# Patient Record
Sex: Female | Born: 1970
Health system: Southern US, Community
[De-identification: ages and names within clinical notes are randomized; demographics above are authoritative.]

## PROBLEM LIST (undated history)

## (undated) DIAGNOSIS — B019 Varicella without complication: Secondary | ICD-10-CM

## (undated) DIAGNOSIS — IMO0001 Reserved for inherently not codable concepts without codable children: Secondary | ICD-10-CM

## (undated) DIAGNOSIS — R896 Abnormal cytological findings in specimens from other organs, systems and tissues: Secondary | ICD-10-CM

## (undated) DIAGNOSIS — N390 Urinary tract infection, site not specified: Secondary | ICD-10-CM

## (undated) DIAGNOSIS — T7840XA Allergy, unspecified, initial encounter: Secondary | ICD-10-CM

## (undated) DIAGNOSIS — R001 Bradycardia, unspecified: Secondary | ICD-10-CM

## (undated) DIAGNOSIS — S0300XA Dislocation of jaw, unspecified side, initial encounter: Secondary | ICD-10-CM

## (undated) DIAGNOSIS — I959 Hypotension, unspecified: Secondary | ICD-10-CM

## (undated) HISTORY — PX: OTHER SURGICAL HISTORY: SHX169

## (undated) HISTORY — DX: Dislocation of jaw, unspecified side, initial encounter: S03.00XA

## (undated) HISTORY — DX: Urinary tract infection, site not specified: N39.0

## (undated) HISTORY — DX: Varicella without complication: B01.9

## (undated) HISTORY — DX: Hypotension, unspecified: I95.9

## (undated) HISTORY — DX: Allergy, unspecified, initial encounter: T78.40XA

## (undated) HISTORY — DX: Reserved for inherently not codable concepts without codable children: IMO0001

## (undated) HISTORY — DX: Abnormal cytological findings in specimens from other organs, systems and tissues: R89.6

## (undated) HISTORY — PX: APPENDECTOMY: SHX54

## (undated) HISTORY — DX: Bradycardia, unspecified: R00.1

---

## 2000-09-27 ENCOUNTER — Other Ambulatory Visit: Admission: RE | Admit: 2000-09-27 | Discharge: 2000-09-27 | Payer: Self-pay | Admitting: *Deleted

## 2000-09-27 ENCOUNTER — Encounter (INDEPENDENT_AMBULATORY_CARE_PROVIDER_SITE_OTHER): Payer: Self-pay | Admitting: Specialist

## 2001-05-02 ENCOUNTER — Other Ambulatory Visit: Admission: RE | Admit: 2001-05-02 | Discharge: 2001-05-02 | Payer: Self-pay | Admitting: *Deleted

## 2001-07-06 ENCOUNTER — Other Ambulatory Visit: Admission: RE | Admit: 2001-07-06 | Discharge: 2001-07-06 | Payer: Self-pay | Admitting: Obstetrics and Gynecology

## 2002-01-29 ENCOUNTER — Ambulatory Visit (HOSPITAL_COMMUNITY): Admission: AD | Admit: 2002-01-29 | Discharge: 2002-01-29 | Payer: Self-pay | Admitting: Obstetrics and Gynecology

## 2002-01-29 ENCOUNTER — Encounter: Payer: Self-pay | Admitting: Obstetrics and Gynecology

## 2002-02-04 ENCOUNTER — Inpatient Hospital Stay (HOSPITAL_COMMUNITY): Admission: AD | Admit: 2002-02-04 | Discharge: 2002-02-07 | Payer: Self-pay | Admitting: Obstetrics & Gynecology

## 2002-02-04 ENCOUNTER — Inpatient Hospital Stay (HOSPITAL_COMMUNITY): Admission: AD | Admit: 2002-02-04 | Discharge: 2002-02-04 | Payer: Self-pay | Admitting: Obstetrics & Gynecology

## 2002-03-13 ENCOUNTER — Encounter: Admission: RE | Admit: 2002-03-13 | Discharge: 2002-03-13 | Payer: Self-pay | Admitting: Obstetrics and Gynecology

## 2002-03-13 ENCOUNTER — Encounter: Payer: Self-pay | Admitting: Obstetrics and Gynecology

## 2003-04-02 ENCOUNTER — Other Ambulatory Visit: Admission: RE | Admit: 2003-04-02 | Discharge: 2003-04-02 | Payer: Self-pay | Admitting: Obstetrics and Gynecology

## 2003-12-03 ENCOUNTER — Other Ambulatory Visit: Admission: RE | Admit: 2003-12-03 | Discharge: 2003-12-03 | Payer: Self-pay | Admitting: Obstetrics and Gynecology

## 2004-01-24 ENCOUNTER — Inpatient Hospital Stay (HOSPITAL_COMMUNITY): Admission: AD | Admit: 2004-01-24 | Discharge: 2004-01-24 | Payer: Self-pay | Admitting: Obstetrics and Gynecology

## 2004-05-15 ENCOUNTER — Observation Stay (HOSPITAL_COMMUNITY): Admission: AD | Admit: 2004-05-15 | Discharge: 2004-05-16 | Payer: Self-pay | Admitting: Obstetrics and Gynecology

## 2004-06-24 ENCOUNTER — Inpatient Hospital Stay (HOSPITAL_COMMUNITY): Admission: AD | Admit: 2004-06-24 | Discharge: 2004-06-27 | Payer: Self-pay | Admitting: Obstetrics and Gynecology

## 2006-07-06 ENCOUNTER — Encounter: Admission: RE | Admit: 2006-07-06 | Discharge: 2006-07-06 | Payer: Self-pay | Admitting: Obstetrics and Gynecology

## 2009-09-24 ENCOUNTER — Encounter: Admission: RE | Admit: 2009-09-24 | Discharge: 2009-09-24 | Payer: Self-pay | Admitting: Obstetrics and Gynecology

## 2010-10-02 ENCOUNTER — Encounter: Admission: RE | Admit: 2010-10-02 | Discharge: 2010-10-02 | Payer: Self-pay | Admitting: Obstetrics and Gynecology

## 2010-10-18 LAB — HM MAMMOGRAPHY: HM Mammogram: NEGATIVE

## 2011-01-18 LAB — HM PAP SMEAR: HM Pap smear: NEGATIVE
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
## 2011-05-14 NOTE — H&P (Signed)
NAME:  Danielle Weeks, Danielle Weeks                         ACCOUNT NO.:  [id number]   MEDICAL RECORD NO.:  [id number]                   PATIENT TYPE:  INP   LOCATION:  9170                                 FACILITY:  [facility]   PHYSICIAN:  Maxie Better, M.D.            DATE OF BIRTH:  03-Nov-1971   DATE OF ADMISSION:  06/24/2004  DATE OF DISCHARGE:                                HISTORY & PHYSICAL   CHIEF COMPLAINT:  Oligohydramnios.   HISTORY OF PRESENT ILLNESS:  This is a 40 year old, gravida 2, para 1-0-0-1,  married white female, LMP of September 30, 2003, [facility] of July 07, 2004 whose  currently at 38 plus weeks gestation with oligohydramnios admitted for  induction of labor secondary to oligohydramnios. The patient underwent an  ultrasound on June 24, 2004 where her amniotic fluid index was 6.8 which was  at the third percentile. Estimated fetal weight was 7 pounds 11 ounces which  was 68 percentile. The patient's prenatal course has been complicated by  previous history of oligohydramnios for which the patient was admitted to  [facility] for aggressive IV hydration with subsequent resolution of  her oligohydramnios. The patient denies any leakage of fluid, she has had  good fetal movement and some irregular contractions.  Her prenatal care has  been at [facility], primary obstetrician Maxie Better, M.D.   PRENATAL LABS:  Blood type is O positive, antibody screen is negative, RPR  is nonreactive, rubella is immune, hepatitis B surface antigen is negative,  HIV test was negative.  GC and chlamydia cultures were negative.  First  trimester genetic screen was within normal limits.  __________ defect was  normal. She had a one hour glucose challenge test of 153, her three hour  glucose challenge test was normal.  Her group B strep culture was negative.  Anatomic fetal survey was normal at 18.2 weeks on February 9.   ALLERGIES:  No known drug allergies.   MEDICATIONS:  Prenatal  vitamins.   MEDICAL HISTORY:  Possible irritable bowel syndrome.   SURGICAL HISTORY:  Negative.   OBSTETRICAL HISTORY:  7 pounds 3 ounce vacuum extraction February of 2003  for chorioamnionitis and fetal tachycardia.   FAMILY HISTORY:  Noncontributory.   SOCIAL HISTORY:  Married, nonsmoker. 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   REVIEW OF SYMPTOMS:  Negative.   PHYSICAL EXAMINATION:  GENERAL:  Well-developed, well-nourished, gravid  female in no acute distress.  VITAL SIGNS:  Blood pressure 107/74, pulse of 98, afebrile.  SKIN:  Shows no lesions.  HEENT:  Anicteric sclera, pink conjunctiva, oropharynx negative.  HEART:  Regular rate and rhythm with a grade 2/6 systolic ejection murmur.  LUNGS:  Clear to auscultation.  BREASTS:  Soft, nontender, no palpable mass.  BACK:  No CVA tenderness.  ABDOMEN:  Gravid, fundal height of 36 cm.  PELVIC:  1 cm thick, -2 vertex.  EXTREMITIES:  No  edema.  TRACING:  Fetal heart rate baseline 140, accelerations to 160, contractions  every 1-4 minutes initially now every 2 to 2.5 minutes.   IMPRESSION:  Oligohydramnios term gestation.   PLAN:  Admission.  Amniotomy p.r.n., epidural p.r.n., low dose Pitocin per  protocol.  Routine admission orders and labs.  __________fusion p.r.n.                                               Maxie Better, M.D.    [place]/MEDQ  D:  06/24/2004  T:  06/24/2004  Job:  [id number]

## 2011-05-14 NOTE — H&P (Signed)
[facility]  Patient:    Danielle Weeks, Danielle Weeks Visit Number: [id number] MRN: [id number]          Service Type: OBS Location: 910B 9160 01 Attending Physician:  Genia Del Dictated by:   Genia Del, M.D. Admit Date:  02/04/2002                           History and Physical  DATE OF BIRTH:  April 19, 1971.  HISTORY OF PRESENT ILLNESS:  The patient is a 40 year old G1, expected date of delivery by ultrasound January 29, 2002, at 40 weeks and 6 days gestation.  REASON FOR ADMISSION:  Continuous labor with regular uterine contractions every 3 to 4 minutes at 1 p.m. today.  HISTORY OF PRESENT ILLNESS:  The patient had increased uterine contractions since this morning.  They then became regular every 3 to 4 minutes at 1 p.m. Patient had presented to maternity admission, went back home, and then came back around 2 p.m.  She had a significant cervical change at that time.  Fetal movements positive.  No vaginal bleeding, no fluid leak.  No PIH symptoms.  PAST MEDICAL HISTORY:  Negative.  PAST SURGICAL HISTORY:  Negative.  PAST OBSTETRICAL HISTORY:  Negative.  PAST GYNECOLOGIC HISTORY:  Had a normal Pap test.  colposcopy was done.  No treatment done October 2001.  All Pap smears normal.  SOCIAL HISTORY:  Married.  Nonsmoker.  ALLERGIES:  No no known drug allergies.  MEDICATIONS: 1. Macrobid q.d. prophylaxis for recurrent cystitis. 2. Prenatal vitamins.  HISTORY OF PRESENT PREGNANCY:  First trimester was normal.  Labs in the first trimester showed a hemoglobin at 11.9, platelets 272, blood type 0+, Rh antibodies negative, RPR nonreactive, HBsAg negative, HIV negative, rubella titer immune.  Pap smear test within normal limits.  IgG positive for varicella.  In the first trimester she had an ultrasound at 11 plus weeks dating was then changed per ultrasound with an expected date of January 29, 2002.  In the early second trimester the  patient was evaluated at 12 plus weeks with cystitis.  Because of her recurrent history of cystitis she was treated with Macrobid for 10 days and then started on prophylaxis with Macrobid q.d. Triple test was within normal limits in the second trimester.  An ultrasound review of anatomy was within normal limits at 20 plus weeks.  Placenta was anterior normal in the third trimester.  1 hour gtt. was at 74 within normal limits at 35 plus weeks.  Group B streptococcus was done and came back negative.  Blood pressures remained normal throughout pregnancy.  The patient continued Macrobid suppression without recurrent cystitis and she complained of sciatica which remained stable until now.  REVIEW OF SYSTEMS:  Constitutional negative.  HEENT:  Negative.  Respiratory, cardiovascular, urologic, GI:  Negative.  Neurologic, endocrine, dermatologic:  Negative.  PHYSICAL EXAMINATION:  GENERAL:  Pain with uterine contractions.  VITAL SIGNS:  Stable.  Blood pressure 114/68, pulse 86, respiratory rate 24, temperature 97.4.  LUNGS:  Clear bilaterally.  HEART:  Regular cardiac rhythm, no murmur.  ABDOMEN:  Gravid.   Uterine height corresponds.  Cephalic presentation.  VAGINAL EXAMINATION:  On admission 3 cm.  80% vertex, minus to minus one, membranes intact.  Lower limbs normal.  Monitoring uterine contractions every 4 minutes lasting 60 to 70 seconds plus.  Fetal heart rate monitoring reactive with good accelerations, no decelerations.  Baseline 140.  IMPRESSION:  G1,  40 weeks and 6 days gestation in spontaneous labor.  GBS negative.  Fetal well-being reassuring.  PLAN:  Admit to labor and delivery.  Continuous monitoring.  Expectant management towards probable vaginal delivery. Dictated by:   Genia Del, M.D. Attending Physician:  Genia Del DD:  02/04/02 TD:  02/04/02 Job: [id number]

## 2011-05-14 NOTE — H&P (Signed)
NAME:  Danielle Weeks, Danielle Weeks                         ACCOUNT NO.:  [id number]   MEDICAL RECORD NO.:  [id number]                   PATIENT TYPE:  INP   LOCATION:  9169                                 FACILITY:  [facility]   PHYSICIAN:  Richardean Sale, M.D.                DATE OF BIRTH:  [birth year]   DATE OF ADMISSION:  05/15/2004  DATE OF DISCHARGE:                                HISTORY & PHYSICAL   ADMISSION DIAGNOSIS:  Oligohydramnios.   HISTORY OF PRESENT ILLNESS:  This is a 40 year old gravida 2, para 1-0-0-1,  white female at 32-4/7 weeks' gestation with a last menstrual period of July 07, 2004, who was evaluated in the office today as she has a history of  oligohydramnios this pregnancy.  Ultrasound at 31 weeks revealed an AFI of  7.9.  The patient was placed on home bed rest with instruction to increase  her fluid intake, and follow-up AFI was 14.  That was a week ago.  Today she  came back in for a repeat BPP.  Her AFI is only 6, which is less than the  3rd percentile for her gestational age.  BPP is 6/8.  Her umbilical and  uterine artery Dopplers are normal.  She is being admitted for hospital bed  rest and IV fluid therapy.  The patient denies any vaginal bleeding, loss of  fluid, or contractions, and reports good fetal movement.  She has no history  of oligohydramnios with her previous pregnancy.   PAST OBSTETRICAL HISTORY:  First pregnancy, vaginal birth at 46+ weeks'  gestation.  No history of oligohydramnios.  No history of hypertension or  diabetes that pregnancy.   GYNECOLOGIC HISTORY:  Denies any history of STDs.  Does have a history of  abnormal Pap smear.  No history of LEEP or laser to the cervix.   PAST MEDICAL HISTORY:  No prior hospitalizations with the exception of  pregnancy.  Positive history of postpartum anemia.   PAST SURGICAL HISTORY:  None.   FAMILY HISTORY:  No congenital anomalies.  No birth defects.  No babies born  with trisomies, mental retardation, or  cystic fibrosis.   SOCIAL HISTORY:  No tobacco, alcohol, or drugs.   PHYSICAL EXAMINATION:  VITAL SIGNS:  She is afebrile.  Vital signs are  stable.  GENERAL:  She is a well-developed, well-nourished white female in no  apparent distress.  ABDOMEN:  Gravid, soft, nontender.  EXTREMITIES:  No cyanosis, clubbing, or edema.  PELVIC:  Deferred.   ASSESSMENT:  A 40 year old gravida 2, para 1, white female at 32-4/7 weeks'  gestation, with oligohydramnios.   PLAN:  1. Will admit for observation and IV fluid therapy and bed rest.  2. Plan repeat BPP on May 16, 2004.  3. Continuous fetal monitoring.  4. Further disposition pending results of follow-up ultrasound.  Richardean Sale, M.D.    JW/MEDQ  D:  05/15/2004  T:  05/15/2004  Job:  [id number]

## 2011-10-19 ENCOUNTER — Encounter: Payer: Self-pay | Admitting: Family Medicine

## 2011-10-19 ENCOUNTER — Ambulatory Visit (INDEPENDENT_AMBULATORY_CARE_PROVIDER_SITE_OTHER): Payer: 59 | Admitting: Family Medicine

## 2011-10-19 ENCOUNTER — Other Ambulatory Visit: Payer: Self-pay | Admitting: Obstetrics and Gynecology

## 2011-10-19 VITALS — BP 80/62 | HR 72 | Temp 99.1°F | Resp 12 | Ht 65.25 in | Wt 134.0 lb

## 2011-10-19 DIAGNOSIS — Z1231 Encounter for screening mammogram for malignant neoplasm of breast: Secondary | ICD-10-CM

## 2011-10-19 DIAGNOSIS — Z0000 Encounter for general adult medical examination without abnormal findings: Secondary | ICD-10-CM

## 2011-10-19 LAB — HEPATIC FUNCTION PANEL
ALT: 16 U/L (ref 0–35)
Albumin: 4.5 g/dL (ref 3.5–5.2)
Bilirubin, Direct: 0.1 mg/dL (ref 0.0–0.3)
Total Protein: 7.7 g/dL (ref 6.0–8.3)

## 2011-10-19 LAB — CBC WITH DIFFERENTIAL/PLATELET
Basophils Absolute: 0 10*3/uL (ref 0.0–0.1)
Basophils Relative: 0.6 % (ref 0.0–3.0)
Eosinophils Absolute: 0.3 10*3/uL (ref 0.0–0.7)
Hemoglobin: 13.3 g/dL (ref 12.0–15.0)
Lymphocytes Relative: 39.4 % (ref 12.0–46.0)
MCHC: 33.5 g/dL (ref 30.0–36.0)
MCV: 87.4 fl (ref 78.0–100.0)
Monocytes Absolute: 0.5 10*3/uL (ref 0.1–1.0)
Neutro Abs: 3.6 10*3/uL (ref 1.4–7.7)
Neutrophils Relative %: 48.5 % (ref 43.0–77.0)
RDW: 14 % (ref 11.5–14.6)

## 2011-10-19 LAB — BASIC METABOLIC PANEL
Chloride: 102 mEq/L (ref 96–112)
GFR: 119.1 mL/min (ref >60.00)
Potassium: 4.5 mEq/L (ref 3.5–5.1)

## 2011-10-19 LAB — LIPID PANEL
Cholesterol: 145 mg/dL (ref 0–200)
LDL Cholesterol: 53 mg/dL (ref 0–99)
Triglycerides: 73 mg/dL (ref 0.0–149.0)
VLDL: 14.6 mg/dL (ref 0.0–40.0)

## 2011-10-19 LAB — HEMOGLOBIN A1C: Hgb A1c MFr Bld: 5.4 % (ref 4.6–6.5)

## 2011-10-19 NOTE — Patient Instructions (Signed)
Try to establish more regular exercise  

## 2011-10-19 NOTE — Progress Notes (Signed)
  Subjective:    Patient ID: Danielle Weeks, female    DOB: 11-18-1971, 40 y.o.   MRN: [id number]  HPI  New patient to establish care. Patient sees a gynecologist regularly. She is here for well visit. Past history reviewed. Has history recurrent UTIs though none in several years. History of intermittent TMJ problems. Previous abnormal Pap smear with ASCUS.  No regular medications. No prior surgeries. 2 vaginal births.  Family history is that of her maternal grandfather and maternal grandmother with heart disease. Maternal grandmother had colon cancer. Paternal grandfather had emphysema. Paternal grandmother some type of adult-onset leukemia. Father with acoustic neuroma.  Patient is married. Rare alcohol use. Nonsmoker. 2 children. Works as a Futures trader.  History of reported vitamin D deficiency. Inconsistent with supplement.  Past Medical History  Diagnosis Date  . Chicken pox   . UTI (lower urinary tract infection)   . TMJ (dislocation of temporomandibular joint)   . ASCUS (atypical squamous cells of undetermined significance) on Pap smear     biopsied twice   History reviewed. No pertinent past surgical history.  reports that she has never smoked. She does not have any smokeless tobacco history on file. Her alcohol and drug histories not on file. family history includes Alcohol abuse in her maternal grandfather; Birth defects in her maternal grandmother; Cancer in her maternal grandmother; and Leukemia in her paternal grandfather. Allergies  Allergen Reactions  . Penicillins     GI upset      Review of Systems  Constitutional: Negative for fever, activity change, appetite change, fatigue and unexpected weight change.  HENT: Negative for hearing loss, ear pain, sore throat and trouble swallowing.   Eyes: Negative for visual disturbance.  Respiratory: Negative for cough and shortness of breath.   Cardiovascular: Negative for chest pain and palpitations.  Gastrointestinal:  Negative for abdominal pain, diarrhea, constipation and blood in stool.  Genitourinary: Negative for dysuria and hematuria.  Musculoskeletal: Negative for myalgias, back pain and arthralgias.  Skin: Negative for rash.  Neurological: Negative for dizziness, syncope and headaches.  Hematological: Negative for adenopathy.  Psychiatric/Behavioral: Negative for confusion and dysphoric mood.       Objective:   Physical Exam  Constitutional: She is oriented to person, place, and time. She appears well-developed and well-nourished.  HENT:  Head: Normocephalic and atraumatic.  Eyes: EOM are normal. Pupils are equal, round, and reactive to light.  Neck: Normal range of motion. Neck supple. No thyromegaly present.  Cardiovascular: Normal rate, regular rhythm and normal heart sounds.   No murmur heard. Pulmonary/Chest: Breath sounds normal. No respiratory distress. She has no wheezes. She has no rales.  Abdominal: Soft. Bowel sounds are normal. She exhibits no distension and no mass. There is no tenderness. There is no rebound and no guarding.  Genitourinary:       per GYN  Musculoskeletal: Normal range of motion. She exhibits no edema.  Lymphadenopathy:    She has no cervical adenopathy.  Neurological: She is alert and oriented to person, place, and time. She displays normal reflexes. No cranial nerve deficit.  Skin: No rash noted.  Psychiatric: She has a normal mood and affect. Her behavior is normal. Judgment and thought content normal.          Assessment & Plan:  Complete physical. Patient appears to be healthy with no acute or chronic medical problems. Check screening labs. Add vitamin D level with history of reported deficiency in the past. Establish more consistent exercise.

## 2011-10-20 NOTE — Progress Notes (Signed)
Quick Note:  Pt informed on home VM ______ 

## 2011-11-09 ENCOUNTER — Ambulatory Visit: Payer: Self-pay

## 2011-11-09 ENCOUNTER — Ambulatory Visit
Admission: RE | Admit: 2011-11-09 | Discharge: 2011-11-09 | Disposition: A | Discharge status: Home / Self Care | Payer: 59 | Source: Ambulatory Visit | Attending: Obstetrics and Gynecology | Admitting: Obstetrics and Gynecology

## 2011-11-09 DIAGNOSIS — Z1231 Encounter for screening mammogram for malignant neoplasm of breast: Secondary | ICD-10-CM

## 2011-11-10 ENCOUNTER — Encounter: Payer: Self-pay | Admitting: Family Medicine

## 2012-09-28 ENCOUNTER — Ambulatory Visit: Payer: 59 | Admitting: Family Medicine

## 2012-10-09 ENCOUNTER — Other Ambulatory Visit: Payer: Self-pay | Admitting: Obstetrics and Gynecology

## 2012-10-09 DIAGNOSIS — Z1231 Encounter for screening mammogram for malignant neoplasm of breast: Secondary | ICD-10-CM

## 2012-11-14 ENCOUNTER — Ambulatory Visit
Admission: RE | Admit: 2012-11-14 | Discharge: 2012-11-14 | Disposition: A | Discharge status: Home / Self Care | Payer: 59 | Source: Ambulatory Visit | Attending: Obstetrics and Gynecology | Admitting: Obstetrics and Gynecology

## 2012-11-14 DIAGNOSIS — Z1231 Encounter for screening mammogram for malignant neoplasm of breast: Secondary | ICD-10-CM

## 2012-12-21 ENCOUNTER — Telehealth: Payer: Self-pay | Admitting: Family Medicine

## 2012-12-21 NOTE — Telephone Encounter (Signed)
[facility] Triage Call Report Triage Record Num: [id number] Operator: Geanie Berlin Patient Name: Danielle Weeks Call Date & Time: 12/20/2012 9:26:13AM Patient Phone: [phone] PCP: Evelena Peat Patient Gender: Female PCP Fax : [phone] Patient DOB: 1971-12-17 [facility] Name: [facility]  Reason for Call: Caller: Danielle Weeks/Patient; PCP: Evelena Peat (Family Practice); CB#: [phone]; Patient Weight: 130 lbs; Call regarding severe Diarrhea and vomiting; Onset: 0400 12/20/12. Afebrile. LMP approx 12/06/12. Husband Danielle Weeks. Reports approximately 3 episodes of vomiting and 30 diarrhea stools. Last emesis at 0915. Reported not keeping sips of water down. Last void at 0700; is not dizzy when stands, just feels weak. Home care advice given for all other situations per Nausea or Vomiting guideline. Both [facility] at [employer] center and [facility] [address] are both closed. Unable to call out RX for Phenergan 25 mg rectal suppositories. Reviewed gradual hydration method.  Protocol(s) Used: Nausea or Vomiting Recommended Outcome per Protocol: Provide Home/Self Care Reason for Outcome: All other situations

## 2013-02-02 ENCOUNTER — Encounter: Payer: Self-pay | Admitting: Family Medicine

## 2013-02-02 ENCOUNTER — Ambulatory Visit (INDEPENDENT_AMBULATORY_CARE_PROVIDER_SITE_OTHER): Payer: 59 | Admitting: Family Medicine

## 2013-02-02 VITALS — BP 90/60 | HR 88 | Temp 98.4°F | Wt 134.0 lb

## 2013-02-02 DIAGNOSIS — J029 Acute pharyngitis, unspecified: Secondary | ICD-10-CM

## 2013-02-02 LAB — POCT RAPID STREP A (OFFICE): Rapid Strep A Screen: NEGATIVE

## 2013-02-02 NOTE — Patient Instructions (Addendum)
INSTRUCTIONS FOR UPPER RESPIRATORY INFECTION:  -plenty of rest and fluids  -nasal saline wash 2-3 times daily (use prepackaged nasal saline or bottled/distilled water if making your own)   -can use sinex/afrin nasal spray for drainage and nasal congestion - but do NOT use longer then 3-4 days  -can use tylenol or ibuprofen as directed for aches and sorethroat  -in the winter time, using a humidifier at night is helpful (please follow cleaning instructions)  -if you are taking a cough medication - use only as directed, may also try a teaspoon of honey to coat the throat and throat lozenges  -for sore throat, salt water gargles can help  -follow up if you have fevers, facial pain, tooth pain, difficulty breathing or are worsening or not getting better in 5-7 days  

## 2013-02-02 NOTE — Progress Notes (Signed)
Chief Complaint  Patient presents with  . Cough    congestion, sneezing, sore throat with white spots, backache, headache     HPI:  Acute visit for sinus issuest: -started 2 days ago -symptoms: congestion, sneezing, coughing, sore throat, drainage in throat, malaise, white spots in throat, body aches -has tried: advil, advil sinus -denies: fevers, ear pain, sinus pain, tooth pain, SOB, NVD  ROS: See pertinent positives and negatives per HPI.  Past Medical History  Diagnosis Date  . Chicken pox   . UTI (lower urinary tract infection)   . TMJ (dislocation of temporomandibular joint)   . ASCUS (atypical squamous cells of undetermined significance) on Pap smear     biopsied twice    Family History  Problem Relation Age of Onset  . Cancer Maternal Grandmother   . Birth defects Maternal Grandmother     colon  . Alcohol abuse Maternal Grandfather   . Leukemia Paternal Grandfather   . Cancer Father     acoustic neuroma    History   Social History  . Marital Status: Married    Spouse Name: N/A    Number of Children: N/A  . Years of Education: N/A   Social History Main Topics  . Smoking status: Never Smoker   . Smokeless tobacco: None  . Alcohol Use: None  . Drug Use: None  . Sexually Active: None   Other Topics Concern  . None   Social History Narrative  . None    Current outpatient prescriptions:nitrofurantoin, macrocrystal-monohydrate, (MACROBID) 100 MG capsule, One tab after intercourse only as needed , Disp: , Rfl:   EXAM:  Filed Vitals:   02/02/13 0832  BP: 90/60  Pulse: 88  Temp: 98.4 F (36.9 C)    There is no height on file to calculate BMI.  GENERAL: vitals reviewed and listed above, alert, oriented, appears well hydrated and in no acute distress  HEENT: atraumatic, conjunttiva clear, no obvious abnormalities on inspection of external nose and ears, normal appearance of ear canals and TMs, clear nasal congestion, mild post oropharyngeal  erythema with PND, no tonsillar edema or exudate, no sinus TTP  NECK: no obvious masses on inspection  LUNGS: clear to auscultation bilaterally, no wheezes, rales or rhonchi, good air movement  CV: HRRR, no peripheral edema  MS: moves all extremities without noticeable abnormality  PSYCH: pleasant and cooperative, no obvious depression or anxiety  ASSESSMENT AND PLAN:  Discussed the following assessment and plan:  1. Sore throat  POCT rapid strep A   -likely viral, supportive care and return precautions advised - rapid strep per her concern negative -Patient advised to return or notify a doctor immediately if symptoms worsen or persist or new concerns arise.  Patient Instructions  INSTRUCTIONS FOR UPPER RESPIRATORY INFECTION:  -plenty of rest and fluids  -nasal saline wash 2-3 times daily (use prepackaged nasal saline or bottled/distilled water if making your own)   -can use sinex/afrin nasal spray for drainage and nasal congestion - but do NOT use longer then 3-4 days  -can use tylenol or ibuprofen as directed for aches and sorethroat  -in the winter time, using a humidifier at night is helpful (please follow cleaning instructions)  -if you are taking a cough medication - use only as directed, may also try a teaspoon of honey to coat the throat and throat lozenges  -for sore throat, salt water gargles can help  -follow up if you have fevers, facial pain, tooth pain, difficulty breathing or are  worsening or not getting better in 5-7 days      KIM, Dahlia Client R.

## 2013-06-28 ENCOUNTER — Observation Stay (HOSPITAL_COMMUNITY)
Admission: EM | Admit: 2013-06-28 | Discharge: 2013-06-30 | Disposition: A | Discharge status: Home / Self Care | Payer: 59 | Attending: General Surgery | Admitting: General Surgery

## 2013-06-28 ENCOUNTER — Encounter (HOSPITAL_COMMUNITY): Payer: Self-pay | Admitting: *Deleted

## 2013-06-28 DIAGNOSIS — R1031 Right lower quadrant pain: Secondary | ICD-10-CM | POA: Insufficient documentation

## 2013-06-28 DIAGNOSIS — K3580 Unspecified acute appendicitis: Principal | ICD-10-CM

## 2013-06-28 LAB — COMPREHENSIVE METABOLIC PANEL
ALT: 14 U/L (ref 0–35)
AST: 10 U/L (ref 0–37)
CO2: 25 mEq/L (ref 19–32)
Chloride: 101 mEq/L (ref 96–112)
Creatinine, Ser: 0.71 mg/dL (ref 0.50–1.10)
GFR calc Af Amer: >90 mL/min (ref >90)
GFR calc non Af Amer: >90 mL/min (ref >90)
Glucose, Bld: 122 mg/dL — ABNORMAL HIGH (ref 70–99)
Total Bilirubin: 0.5 mg/dL (ref 0.3–1.2)

## 2013-06-28 LAB — CBC WITH DIFFERENTIAL/PLATELET
Basophils Absolute: 0 10*3/uL (ref 0.0–0.1)
HCT: 38.1 % (ref 36.0–46.0)
Hemoglobin: 13 g/dL (ref 12.0–15.0)
Lymphocytes Relative: 15 % (ref 12–46)
Lymphs Abs: 2.3 10*3/uL (ref 0.7–4.0)
MCV: 85.6 fL (ref 78.0–100.0)
Monocytes Absolute: 1 10*3/uL (ref 0.1–1.0)
Neutro Abs: 12.2 10*3/uL — ABNORMAL HIGH (ref 1.7–7.7)
RBC: 4.45 MIL/uL (ref 3.87–5.11)
RDW: 13.8 % (ref 11.5–15.5)
WBC: 15.8 10*3/uL — ABNORMAL HIGH (ref 4.0–10.5)

## 2013-06-28 LAB — URINALYSIS, ROUTINE W REFLEX MICROSCOPIC
Glucose, UA: NEGATIVE mg/dL
Hgb urine dipstick: NEGATIVE
Ketones, ur: 15 mg/dL — AB
Protein, ur: NEGATIVE mg/dL
Urobilinogen, UA: 0.2 mg/dL (ref 0.0–1.0)

## 2013-06-28 MED ORDER — SODIUM CHLORIDE 0.9 % IV BOLUS (SEPSIS)
1000.0000 mL | Freq: Once | INTRAVENOUS | Status: AC
  Administered 2013-06-28: 1000 mL via INTRAVENOUS

## 2013-06-28 MED ORDER — MORPHINE SULFATE 4 MG/ML IJ SOLN
4.0000 mg | Freq: Once | INTRAMUSCULAR | Status: AC
  Administered 2013-06-28: 4 mg via INTRAVENOUS
  Filled 2013-06-28: qty 1

## 2013-06-28 MED ORDER — IOHEXOL 300 MG/ML  SOLN
50.0000 mL | Freq: Once | INTRAMUSCULAR | Status: AC | PRN
  Administered 2013-06-28: 50 mL via ORAL

## 2013-06-28 MED ORDER — ONDANSETRON HCL 4 MG/2ML IJ SOLN
4.0000 mg | Freq: Once | INTRAMUSCULAR | Status: AC
  Administered 2013-06-28: 4 mg via INTRAVENOUS
  Filled 2013-06-28: qty 2

## 2013-06-28 MED ORDER — ONDANSETRON 4 MG PO TBDP: 8.0000 mg | ORAL_TABLET | Freq: Once | ORAL | Status: DC

## 2013-06-28 NOTE — ED Notes (Signed)
Pt's blood pressure 83/55. RN started liter [place]. PA notified.

## 2013-06-28 NOTE — ED Notes (Signed)
Pt finished 1st cup of contrast. CT paged

## 2013-06-28 NOTE — ED Notes (Signed)
PA at bedside.

## 2013-06-28 NOTE — ED Provider Notes (Signed)
History    CSN: [id number] Arrival date & time 06/28/13  2056  First MD Initiated Contact with Patient 06/28/13 2131     Chief Complaint  Patient presents with  . Abdominal Pain   (Consider location/radiation/quality/duration/timing/severity/associated sxs/prior Treatment) The history is provided by the patient and medical records.    Patient presents to the ED for sudden onset of abdominal pain today around 2 PM. Patient states she ate lunch today around and approximately 2 hours later the pain started. She attributed the pain to something she ate and it was accompanied with 5 bowel movements within 1 hour-- 4 were normal, 1 was loose.  Patient states now pain is localized to her lower abdomen, right worse than left and described as a sharp, searing pain.  Patient denies any nausea or vomiting. No urinary symptoms at this time, however patient that she is prone to urinary tract infections and takes macrobid post-coitally for prevention-- not taken in 1+ week because she is on her menstrual.  No vaginal discharge.  No fevers, sweats, or chills.  No prior abdominal surgeries.  2 vaginal births, no complications.  Has taken multiple OTC gas medications without relief.  Past Medical History  Diagnosis Date  . Chicken pox   . UTI (lower urinary tract infection)   . TMJ (dislocation of temporomandibular joint)   . ASCUS (atypical squamous cells of undetermined significance) on Pap smear     biopsied twice   History reviewed. No pertinent past surgical history. Family History  Problem Relation Age of Onset  . Cancer Maternal Grandmother   . Birth defects Maternal Grandmother     colon  . Alcohol abuse Maternal Grandfather   . Leukemia Paternal Grandfather   . Cancer Father     acoustic neuroma   History  Substance Use Topics  . Smoking status: Never Smoker   . Smokeless tobacco: Not on file  . Alcohol Use: Not on file   OB History   Grav Para Term Preterm Abortions TAB SAB Ect  Mult Living                 Review of Systems  Gastrointestinal: Positive for abdominal pain.  All other systems reviewed and are negative.    Allergies  Penicillins  Home Medications   Current Outpatient Rx  Name  Route  Sig  Dispense  Refill  . nitrofurantoin, macrocrystal-monohydrate, (MACROBID) 100 MG capsule      One tab after intercourse only as needed           BP 90/60  Pulse 98  Temp(Src) 97.9 F (36.6 C) (Oral)  Resp 20  SpO2 99%  LMP 06/24/2013  Physical Exam  Nursing note and vitals reviewed. Constitutional: She is oriented to person, place, and time. She appears well-developed and well-nourished.  Appears uncomfortable, writhing in bed unable to find a comfortable position  HENT:  Head: Normocephalic and atraumatic.  Mouth/Throat: Oropharynx is clear and moist.  Eyes: Conjunctivae and EOM are normal. Pupils are equal, round, and reactive to light.  Neck: Normal range of motion.  Cardiovascular: Normal rate, regular rhythm and normal heart sounds.   Pulmonary/Chest: Effort normal and breath sounds normal.  Abdominal: Soft. Bowel sounds are normal. There is tenderness. There is tenderness at McBurney's point. There is no guarding, no CVA tenderness and negative Murphy's sign.    Musculoskeletal: Normal range of motion. She exhibits no edema.  Neurological: She is alert and oriented to person, place, and time.  Skin: Skin is warm and dry.  Psychiatric: She has a normal mood and affect.    ED Course  Procedures (including critical care time) Labs Reviewed  CBC WITH DIFFERENTIAL - Abnormal; Notable for the following:    WBC 15.8 (*)    Neutrophils Relative % 78 (*)    Neutro Abs 12.2 (*)    All other components within normal limits  COMPREHENSIVE METABOLIC PANEL - Abnormal; Notable for the following:    Potassium 3.3 (*)    Glucose, Bld 122 (*)    All other components within normal limits  URINALYSIS, ROUTINE W REFLEX MICROSCOPIC - Abnormal;  Notable for the following:    Ketones, ur 15 (*)    All other components within normal limits  LIPASE, BLOOD  POCT PREGNANCY, URINE   Ct Abdomen Pelvis W Contrast  06/29/2013   *RADIOLOGY REPORT*  Clinical Data: Lower abdominal pain, worse on the right.  CT ABDOMEN AND PELVIS WITH CONTRAST  Technique:  Multidetector CT imaging of the abdomen and pelvis was performed following the standard protocol during bolus administration of intravenous contrast.  Contrast: OMNIPAQUE IOHEXOL 300 MG/ML  SOLN, 50mL OMNIPAQUE IOHEXOL 300 MG/ML  SOLN  Comparison: None.  Findings: Lung bases are clear.  No pleural or pericardial effusion.  The gallbladder, liver, spleen, adrenal glands, pancreas, biliary tree and kidneys all appear normal.  The appendix is dilated at 1 cm with periappendiceal inflammatory change consistent with acute appendicitis.  No abscess or perforation is identified.  The stomach and small large bowel appear normal.  A small functional cyst is seen in the right ovary.  Adnexa are otherwise unremarkable.  Uterus and urinary bladder appear normal.  There is no lymphadenopathy.  No focal bony abnormality is identified.  IMPRESSION: Study is positive for acute appendicitis without abscess or perforation.  Critical Value/emergent results were called by telephone at the time of interpretation on 06/29/2013 at 12:35 a.m. to Sharilyn Sites, P.A., who verbally acknowledged these results.   Original Report Authenticated By: Holley Dexter, M.D.   No diagnosis found.  MDM   Labs as above- leukocytosis at 15.8.  U/a without signs of infection.  CT abd/pelvis revealing acute appendicitis without perforation or abscess.  Pain well controlled with IV morphine.  Consulted gen surg, Dr. Janee Morn-- he will see pt and admit.  VS stable for transfer to floor.  Garlon Hatchet, PA-C 06/29/13 [id number]

## 2013-06-28 NOTE — ED Notes (Signed)
Rt. Luq. Pain. Went to x 5 bowel movements (5 normal) x 1 soft stool.

## 2013-06-29 ENCOUNTER — Emergency Department (HOSPITAL_COMMUNITY): Payer: 59

## 2013-06-29 ENCOUNTER — Emergency Department (HOSPITAL_COMMUNITY): Payer: 59 | Admitting: Anesthesiology

## 2013-06-29 ENCOUNTER — Encounter (HOSPITAL_COMMUNITY): Payer: Self-pay | Admitting: Anesthesiology

## 2013-06-29 ENCOUNTER — Encounter (HOSPITAL_COMMUNITY)
Admission: EM | Disposition: A | Discharge status: Home / Self Care | Payer: Self-pay | Source: Home / Self Care | Attending: Emergency Medicine

## 2013-06-29 ENCOUNTER — Encounter (HOSPITAL_COMMUNITY): Payer: Self-pay | Admitting: Radiology

## 2013-06-29 DIAGNOSIS — K3580 Unspecified acute appendicitis: Secondary | ICD-10-CM

## 2013-06-29 HISTORY — PX: LAPAROSCOPIC APPENDECTOMY: SHX408

## 2013-06-29 SURGERY — APPENDECTOMY, LAPAROSCOPIC
Anesthesia: General | Site: Abdomen | Wound class: Contaminated

## 2013-06-29 MED ORDER — MIDAZOLAM HCL 2 MG/2ML IJ SOLN: 2.0000 mg | Freq: Once | INTRAMUSCULAR | Status: DC

## 2013-06-29 MED ORDER — OXYCODONE HCL 5 MG PO TABS
5.0000 mg | ORAL_TABLET | ORAL | Status: DC | PRN
  Administered 2013-06-29: 5 mg via ORAL
  Filled 2013-06-29: qty 1

## 2013-06-29 MED ORDER — ACETAMINOPHEN 325 MG PO TABS: 650.0000 mg | ORAL_TABLET | ORAL | Status: DC | PRN

## 2013-06-29 MED ORDER — TRAMADOL HCL 50 MG PO TABS
50.0000 mg | ORAL_TABLET | Freq: Four times a day (QID) | ORAL | Status: DC | PRN
  Administered 2013-06-29 – 2013-06-30 (×2): 50 mg via ORAL
  Filled 2013-06-29 (×2): qty 1

## 2013-06-29 MED ORDER — ALUM & MAG HYDROXIDE-SIMETH 200-200-20 MG/5ML PO SUSP: 30.0000 mL | Freq: Four times a day (QID) | ORAL | Status: DC | PRN

## 2013-06-29 MED ORDER — ROCURONIUM BROMIDE 100 MG/10ML IV SOLN
INTRAVENOUS | Status: DC | PRN
  Administered 2013-06-29: 25 mg via INTRAVENOUS
  Administered 2013-06-29: 15 mg via INTRAVENOUS

## 2013-06-29 MED ORDER — SODIUM CHLORIDE 0.9 % IV SOLN
INTRAVENOUS | Status: DC | PRN
  Administered 2013-06-29: 03:00:00 via INTRAVENOUS

## 2013-06-29 MED ORDER — PROPOFOL 10 MG/ML IV BOLUS
INTRAVENOUS | Status: DC | PRN
  Administered 2013-06-29: 110 mg via INTRAVENOUS

## 2013-06-29 MED ORDER — BUPIVACAINE-EPINEPHRINE 0.25% -1:200000 IJ SOLN
INTRAMUSCULAR | Status: DC | PRN
  Administered 2013-06-29: 13 mL

## 2013-06-29 MED ORDER — OXYCODONE HCL 5 MG PO TABS: 5.0000 mg | ORAL_TABLET | Freq: Once | ORAL | Status: DC | PRN

## 2013-06-29 MED ORDER — ONDANSETRON HCL 4 MG/2ML IJ SOLN
INTRAMUSCULAR | Status: DC | PRN
  Administered 2013-06-29: 4 mg via INTRAVENOUS

## 2013-06-29 MED ORDER — SUCCINYLCHOLINE CHLORIDE 20 MG/ML IJ SOLN
INTRAMUSCULAR | Status: DC | PRN
  Administered 2013-06-29: 120 mg via INTRAVENOUS

## 2013-06-29 MED ORDER — IOHEXOL 300 MG/ML  SOLN
100.0000 mL | Freq: Once | INTRAMUSCULAR | Status: AC | PRN
  Administered 2013-06-29: 100 mL via INTRAVENOUS

## 2013-06-29 MED ORDER — MIDAZOLAM HCL 5 MG/5ML IJ SOLN
INTRAMUSCULAR | Status: DC | PRN
  Administered 2013-06-29: 0.5 mg via INTRAVENOUS
  Administered 2013-06-29: 1.5 mg via INTRAVENOUS

## 2013-06-29 MED ORDER — METRONIDAZOLE IN NACL 5-0.79 MG/ML-% IV SOLN
500.0000 mg | Freq: Four times a day (QID) | INTRAVENOUS | Status: DC
  Administered 2013-06-29 – 2013-06-30 (×4): 500 mg via INTRAVENOUS
  Filled 2013-06-29 (×6): qty 100

## 2013-06-29 MED ORDER — FENTANYL CITRATE 0.05 MG/ML IJ SOLN
INTRAMUSCULAR | Status: DC | PRN
  Administered 2013-06-29 (×4): 50 ug via INTRAVENOUS

## 2013-06-29 MED ORDER — ONDANSETRON HCL 4 MG/2ML IJ SOLN
4.0000 mg | Freq: Four times a day (QID) | INTRAMUSCULAR | Status: DC | PRN
  Administered 2013-06-30: 4 mg via INTRAVENOUS
  Filled 2013-06-29: qty 2

## 2013-06-29 MED ORDER — LACTATED RINGERS IV SOLN
INTRAVENOUS | Status: DC | PRN
  Administered 2013-06-29: 04:00:00 via INTRAVENOUS

## 2013-06-29 MED ORDER — BISACODYL 10 MG RE SUPP: 10.0000 mg | Freq: Two times a day (BID) | RECTAL | Status: DC | PRN

## 2013-06-29 MED ORDER — OXYCODONE-ACETAMINOPHEN 5-325 MG PO TABS
1.0000 | ORAL_TABLET | ORAL | Status: DC | PRN
  Administered 2013-06-29: 1 via ORAL
  Filled 2013-06-29: qty 1

## 2013-06-29 MED ORDER — HYDROMORPHONE HCL PF 1 MG/ML IJ SOLN
INTRAMUSCULAR | Status: AC
  Filled 2013-06-29: qty 1

## 2013-06-29 MED ORDER — HYDROMORPHONE HCL PF 1 MG/ML IJ SOLN: 0.5000 mg | INTRAMUSCULAR | Status: DC | PRN

## 2013-06-29 MED ORDER — GLYCOPYRROLATE 0.2 MG/ML IJ SOLN
INTRAMUSCULAR | Status: DC | PRN
  Administered 2013-06-29: .8 mg via INTRAVENOUS

## 2013-06-29 MED ORDER — MORPHINE SULFATE 2 MG/ML IJ SOLN: 2.0000 mg | INTRAMUSCULAR | Status: DC | PRN

## 2013-06-29 MED ORDER — NEOSTIGMINE METHYLSULFATE 1 MG/ML IJ SOLN
INTRAMUSCULAR | Status: DC | PRN
  Administered 2013-06-29: 5 mg via INTRAVENOUS

## 2013-06-29 MED ORDER — METRONIDAZOLE IN NACL 5-0.79 MG/ML-% IV SOLN
500.0000 mg | Freq: Once | INTRAVENOUS | Status: AC
  Administered 2013-06-29: 500 mg via INTRAVENOUS
  Filled 2013-06-29: qty 100

## 2013-06-29 MED ORDER — SODIUM CHLORIDE 0.9 % IV SOLN
Freq: Once | INTRAVENOUS | Status: AC
  Administered 2013-06-29: 02:00:00 via INTRAVENOUS

## 2013-06-29 MED ORDER — OXYCODONE HCL 5 MG/5ML PO SOLN: 5.0000 mg | Freq: Once | ORAL | Status: DC | PRN

## 2013-06-29 MED ORDER — ONDANSETRON HCL 4 MG PO TABS: 4.0000 mg | ORAL_TABLET | Freq: Four times a day (QID) | ORAL | Status: DC | PRN

## 2013-06-29 MED ORDER — DIPHENHYDRAMINE HCL 50 MG/ML IJ SOLN: 12.5000 mg | Freq: Four times a day (QID) | INTRAMUSCULAR | Status: DC | PRN

## 2013-06-29 MED ORDER — BLISTEX EX OINT
1.0000 "application " | TOPICAL_OINTMENT | Freq: Two times a day (BID) | CUTANEOUS | Status: DC
  Filled 2013-06-29: qty 10

## 2013-06-29 MED ORDER — MORPHINE SULFATE 4 MG/ML IJ SOLN
4.0000 mg | Freq: Once | INTRAMUSCULAR | Status: AC
  Administered 2013-06-29: 4 mg via INTRAVENOUS
  Filled 2013-06-29: qty 1

## 2013-06-29 MED ORDER — PROMETHAZINE HCL 25 MG/ML IJ SOLN: 12.5000 mg | Freq: Four times a day (QID) | INTRAMUSCULAR | Status: DC | PRN

## 2013-06-29 MED ORDER — 0.9 % SODIUM CHLORIDE (POUR BTL) OPTIME
TOPICAL | Status: DC | PRN
  Administered 2013-06-29: 1000 mL

## 2013-06-29 MED ORDER — CIPROFLOXACIN IN D5W 400 MG/200ML IV SOLN
400.0000 mg | Freq: Once | INTRAVENOUS | Status: AC
  Administered 2013-06-29: 400 mg via INTRAVENOUS

## 2013-06-29 MED ORDER — MEPERIDINE HCL 25 MG/ML IJ SOLN: 6.2500 mg | INTRAMUSCULAR | Status: DC | PRN

## 2013-06-29 MED ORDER — MAGIC MOUTHWASH: 15.0000 mL | Freq: Four times a day (QID) | ORAL | Status: DC | PRN

## 2013-06-29 MED ORDER — CIPROFLOXACIN IN D5W 400 MG/200ML IV SOLN
400.0000 mg | Freq: Two times a day (BID) | INTRAVENOUS | Status: DC
  Administered 2013-06-29 – 2013-06-30 (×2): 400 mg via INTRAVENOUS
  Filled 2013-06-29 (×3): qty 200

## 2013-06-29 MED ORDER — MIDAZOLAM HCL 2 MG/2ML IJ SOLN
INTRAMUSCULAR | Status: AC
  Filled 2013-06-29: qty 2

## 2013-06-29 MED ORDER — KCL IN DEXTROSE-NACL 20-5-0.45 MEQ/L-%-% IV SOLN
INTRAVENOUS | Status: DC
  Administered 2013-06-29 – 2013-06-30 (×3): via INTRAVENOUS
  Filled 2013-06-29 (×3): qty 1000

## 2013-06-29 MED ORDER — SODIUM CHLORIDE 0.9 % IR SOLN
Status: DC | PRN
  Administered 2013-06-29: 1000 mL

## 2013-06-29 MED ORDER — NAPROXEN 500 MG PO TABS
500.0000 mg | ORAL_TABLET | Freq: Two times a day (BID) | ORAL | Status: DC
  Administered 2013-06-29 – 2013-06-30 (×2): 500 mg via ORAL
  Filled 2013-06-29 (×5): qty 1

## 2013-06-29 MED ORDER — ONDANSETRON HCL 4 MG/2ML IJ SOLN: 4.0000 mg | Freq: Once | INTRAMUSCULAR | Status: DC | PRN

## 2013-06-29 MED ORDER — HYDROMORPHONE HCL PF 1 MG/ML IJ SOLN
0.2500 mg | INTRAMUSCULAR | Status: DC | PRN
  Administered 2013-06-29 (×2): 0.5 mg via INTRAVENOUS

## 2013-06-29 MED ORDER — HEPARIN SODIUM (PORCINE) 5000 UNIT/ML IJ SOLN
5000.0000 [IU] | Freq: Three times a day (TID) | INTRAMUSCULAR | Status: DC
  Administered 2013-06-29 – 2013-06-30 (×3): 5000 [IU] via SUBCUTANEOUS
  Filled 2013-06-29 (×6): qty 1

## 2013-06-29 MED ORDER — KETOROLAC TROMETHAMINE 15 MG/ML IJ SOLN
15.0000 mg | Freq: Four times a day (QID) | INTRAMUSCULAR | Status: DC | PRN
  Administered 2013-06-29: 15 mg via INTRAVENOUS
  Filled 2013-06-29: qty 1

## 2013-06-29 MED ORDER — LACTATED RINGERS IV SOLN: INTRAVENOUS | Status: DC | PRN

## 2013-06-29 MED ORDER — LIDOCAINE HCL (CARDIAC) 20 MG/ML IV SOLN
INTRAVENOUS | Status: DC | PRN
  Administered 2013-06-29: 100 mg via INTRAVENOUS

## 2013-06-29 SURGICAL SUPPLY — 47 items
APPLIER CLIP ROT 10 11.4 M/L (STAPLE) ×2
APR CLP MED LRG 11.4X10 (STAPLE) ×1
BLADE SURG ROTATE 9660 (MISCELLANEOUS) IMPLANT
CANISTER SUCTION 2500CC (MISCELLANEOUS) ×2 IMPLANT
CHLORAPREP W/TINT 26ML (MISCELLANEOUS) ×2 IMPLANT
CLIP APPLIE ROT 10 11.4 M/L (STAPLE) ×1 IMPLANT
CLOTH BEACON ORANGE TIMEOUT ST (SAFETY) ×2 IMPLANT
COVER SURGICAL LIGHT HANDLE (MISCELLANEOUS) ×2 IMPLANT
CUTTER LINEAR ENDO 35 ETS (STAPLE) IMPLANT
CUTTER LINEAR ENDO 35 ETS TH (STAPLE) IMPLANT
DECANTER SPIKE VIAL GLASS SM (MISCELLANEOUS) IMPLANT
DERMABOND ADVANCED (GAUZE/BANDAGES/DRESSINGS) ×1
DERMABOND ADVANCED .7 DNX12 (GAUZE/BANDAGES/DRESSINGS) ×1 IMPLANT
DRAPE UTILITY 15X26 W/TAPE STR (DRAPE) ×4 IMPLANT
ELECT REM PT RETURN 9FT ADLT (ELECTROSURGICAL) ×2
ELECTRODE REM PT RTRN 9FT ADLT (ELECTROSURGICAL) ×1 IMPLANT
ENDOLOOP SUT PDS II  0 18 (SUTURE)
ENDOLOOP SUT PDS II 0 18 (SUTURE) IMPLANT
GLOVE BIO SURGEON STRL SZ8 (GLOVE) ×2 IMPLANT
GLOVE BIOGEL PI IND STRL 7.0 (GLOVE) ×1 IMPLANT
GLOVE BIOGEL PI IND STRL 8 (GLOVE) ×2 IMPLANT
GLOVE BIOGEL PI INDICATOR 7.0 (GLOVE) ×1
GLOVE BIOGEL PI INDICATOR 8 (GLOVE) ×2
GLOVE SURG SS PI 8.0 STRL IVOR (GLOVE) ×2 IMPLANT
GOWN STRL NON-REIN LRG LVL3 (GOWN DISPOSABLE) ×4 IMPLANT
GOWN STRL REIN XL XLG (GOWN DISPOSABLE) ×2 IMPLANT
KIT BASIN OR (CUSTOM PROCEDURE TRAY) ×2 IMPLANT
KIT ROOM TURNOVER OR (KITS) ×2 IMPLANT
NS IRRIG 1000ML POUR BTL (IV SOLUTION) ×2 IMPLANT
PAD ARMBOARD 7.5X6 YLW CONV (MISCELLANEOUS) ×4 IMPLANT
POUCH SPECIMEN RETRIEVAL 10MM (ENDOMECHANICALS) ×2 IMPLANT
RELOAD /EVU35 (ENDOMECHANICALS) IMPLANT
RELOAD CUTTER ETS 35MM STAND (ENDOMECHANICALS) IMPLANT
SCALPEL HARMONIC ACE (MISCELLANEOUS) ×2 IMPLANT
SET IRRIG TUBING LAPAROSCOPIC (IRRIGATION / IRRIGATOR) ×2 IMPLANT
SPECIMEN JAR SMALL (MISCELLANEOUS) ×2 IMPLANT
SUT VIC AB 4-0 PS2 27 (SUTURE) ×2 IMPLANT
SWAB COLLECTION DEVICE MRSA (MISCELLANEOUS) IMPLANT
TOWEL OR 17X24 6PK STRL BLUE (TOWEL DISPOSABLE) ×2 IMPLANT
TOWEL OR 17X26 10 PK STRL BLUE (TOWEL DISPOSABLE) ×2 IMPLANT
TRAY FOLEY CATH 14FR (SET/KITS/TRAYS/PACK) ×2 IMPLANT
TRAY LAPAROSCOPIC (CUSTOM PROCEDURE TRAY) ×2 IMPLANT
TROCAR XCEL 12X100 BLDLESS (ENDOMECHANICALS) ×2 IMPLANT
TROCAR XCEL BLUNT TIP 100MML (ENDOMECHANICALS) ×2 IMPLANT
TROCAR XCEL NON-BLD 5MMX100MML (ENDOMECHANICALS) ×2 IMPLANT
TUBE ANAEROBIC SPECIMEN COL (MISCELLANEOUS) IMPLANT
WATER STERILE IRR 1000ML POUR (IV SOLUTION) ×2 IMPLANT

## 2013-06-29 NOTE — ED Notes (Signed)
Pt transported to CT ?

## 2013-06-29 NOTE — ED Notes (Signed)
Pt returned from CT °

## 2013-06-29 NOTE — ED Notes (Signed)
PA at bedside discussing CT results with pt and family. 

## 2013-06-29 NOTE — Anesthesia Postprocedure Evaluation (Signed)
Anesthesia Post Note  Patient: Danielle Weeks  Procedure(s) Performed: Procedure(s) (LRB): APPENDECTOMY LAPAROSCOPIC (N/A)  Anesthesia type: general  Patient location: PACU  Post pain: Pain level controlled  Post assessment: Patient's Cardiovascular Status Stable  Last Vitals:  Filed Vitals:   06/29/13 0415  BP: 107/57  Pulse:   Temp: 36.4 C  Resp: 17    Post vital signs: Reviewed and stable  Level of consciousness: sedated  Complications: No apparent anesthesia complications

## 2013-06-29 NOTE — Transfer of Care (Signed)
Immediate Anesthesia Transfer of Care Note  Patient: Danielle Weeks  Procedure(s) Performed: Procedure(s): APPENDECTOMY LAPAROSCOPIC (N/A)  Patient Location: PACU  Anesthesia Type:General  Level of Consciousness: sedated and patient cooperative  Airway & Oxygen Therapy: Patient Spontanous Breathing and Patient connected to nasal cannula oxygen  Post-op Assessment: Report given to PACU RN, Post -op Vital signs reviewed and stable and Patient moving all extremities X 4  Post vital signs: Reviewed and stable  Complications: No apparent anesthesia complications

## 2013-06-29 NOTE — H&P (Signed)
Danielle Weeks is an 42 y.o. female.   Chief Complaint: RLQ abdominal pain HPI: Patient complains of some general GI upset for 48 hours. At 2 PM yesterday, she developed upper abdominal pain. She does have some diarrhea. The pain gradually moved and localized to her right lower quadrant. She came to the emergency department for further evaluation. Laboratory studies revealed leukocytosis. CT scan of the abdomen and pelvis shows acute appendicitis. I was asked to see her for surgical treatment.  Past Medical History  Diagnosis Date  . Chicken pox   . UTI (lower urinary tract infection)   . TMJ (dislocation of temporomandibular joint)   . ASCUS (atypical squamous cells of undetermined significance) on Pap smear     biopsied twice    History reviewed. No pertinent past surgical history.  Family History  Problem Relation Age of Onset  . Cancer Maternal Grandmother   . Birth defects Maternal Grandmother     colon  . Alcohol abuse Maternal Grandfather   . Leukemia Paternal Grandfather   . Cancer Father     acoustic neuroma   Social History:  reports that she has never smoked. She does not have any smokeless tobacco history on file. Her alcohol and drug histories are not on file.  Allergies:  Allergies  Allergen Reactions  . Penicillins     GI upset     (Not in a hospital admission)  Results for orders placed during the hospital encounter of 06/28/13 (from the past 48 hour(s))  CBC WITH DIFFERENTIAL     Status: Abnormal   Collection Time    06/28/13  9:13 PM      Result Value Range   WBC 15.8 (*) 4.0 - 10.5 K/uL   RBC 4.45  3.87 - 5.11 MIL/uL   Hemoglobin 13.0  12.0 - 15.0 g/dL   HCT 40.9  81.1 - 91.4 %   MCV 85.6  78.0 - 100.0 fL   MCH 29.2  26.0 - 34.0 pg   MCHC 34.1  30.0 - 36.0 g/dL   RDW 78.2  95.6 - 21.3 %   Platelets 245  150 - 400 K/uL   Neutrophils Relative % 78 (*) 43 - 77 %   Neutro Abs 12.2 (*) 1.7 - 7.7 K/uL   Lymphocytes Relative 15  12 - 46 %   Lymphs  Abs 2.3  0.7 - 4.0 K/uL   Monocytes Relative 6  3 - 12 %   Monocytes Absolute 1.0  0.1 - 1.0 K/uL   Eosinophils Relative 2  0 - 5 %   Eosinophils Absolute 0.2  0.0 - 0.7 K/uL   Basophils Relative 0  0 - 1 %   Basophils Absolute 0.0  0.0 - 0.1 K/uL  COMPREHENSIVE METABOLIC PANEL     Status: Abnormal   Collection Time    06/28/13  9:13 PM      Result Value Range   Sodium 136  135 - 145 mEq/L   Potassium 3.3 (*) 3.5 - 5.1 mEq/L   Chloride 101  96 - 112 mEq/L   CO2 25  19 - 32 mEq/L   Glucose, Bld 122 (*) 70 - 99 mg/dL   BUN 10  6 - 23 mg/dL   Creatinine, Ser 0.86  0.50 - 1.10 mg/dL   Calcium 9.1  8.4 - 57.8 mg/dL   Total Protein 7.0  6.0 - 8.3 g/dL   Albumin 3.8  3.5 - 5.2 g/dL   AST 10  0 -  37 U/L   ALT 14  0 - 35 U/L   Alkaline Phosphatase 64  39 - 117 U/L   Total Bilirubin 0.5  0.3 - 1.2 mg/dL   GFR calc non Af Amer >90  >90 mL/min   GFR calc Af Amer >90  >90 mL/min   Comment:            The eGFR has been calculated     using the CKD EPI equation.     This calculation has not been     validated in all clinical     situations.     eGFR's persistently     <90 mL/min signify     possible Chronic Kidney Disease.  LIPASE, BLOOD     Status: None   Collection Time    06/28/13  9:13 PM      Result Value Range   Lipase 21  11 - 59 U/L  URINALYSIS, ROUTINE W REFLEX MICROSCOPIC     Status: Abnormal   Collection Time    06/28/13  9:38 PM      Result Value Range   Color, Urine YELLOW  YELLOW   APPearance CLEAR  CLEAR   Specific Gravity, Urine 1.020  1.005 - 1.030   pH 8.0  5.0 - 8.0   Glucose, UA NEGATIVE  NEGATIVE mg/dL   Hgb urine dipstick NEGATIVE  NEGATIVE   Bilirubin Urine NEGATIVE  NEGATIVE   Ketones, ur 15 (*) NEGATIVE mg/dL   Protein, ur NEGATIVE  NEGATIVE mg/dL   Urobilinogen, UA 0.2  0.0 - 1.0 mg/dL   Nitrite NEGATIVE  NEGATIVE   Leukocytes, UA NEGATIVE  NEGATIVE   Comment: MICROSCOPIC NOT DONE ON URINES WITH NEGATIVE PROTEIN, BLOOD, LEUKOCYTES, NITRITE, OR  GLUCOSE <1000 mg/dL.  POCT PREGNANCY, URINE     Status: None   Collection Time    06/28/13  9:48 PM      Result Value Range   Preg Test, Ur NEGATIVE  NEGATIVE   Comment:            THE SENSITIVITY OF THIS     METHODOLOGY IS >24 mIU/mL   Ct Abdomen Pelvis W Contrast  06/29/2013   *RADIOLOGY REPORT*  Clinical Data: Lower abdominal pain, worse on the right.  CT ABDOMEN AND PELVIS WITH CONTRAST  Technique:  Multidetector CT imaging of the abdomen and pelvis was performed following the standard protocol during bolus administration of intravenous contrast.  Contrast: OMNIPAQUE IOHEXOL 300 MG/ML  SOLN, 50mL OMNIPAQUE IOHEXOL 300 MG/ML  SOLN  Comparison: None.  Findings: Lung bases are clear.  No pleural or pericardial effusion.  The gallbladder, liver, spleen, adrenal glands, pancreas, biliary tree and kidneys all appear normal.  The appendix is dilated at 1 cm with periappendiceal inflammatory change consistent with acute appendicitis.  No abscess or perforation is identified.  The stomach and small large bowel appear normal.  A small functional cyst is seen in the right ovary.  Adnexa are otherwise unremarkable.  Uterus and urinary bladder appear normal.  There is no lymphadenopathy.  No focal bony abnormality is identified.  IMPRESSION: Study is positive for acute appendicitis without abscess or perforation.  Critical Value/emergent results were called by telephone at the time of interpretation on 06/29/2013 at 12:35 a.m. to Sharilyn Sites, P.A., who verbally acknowledged these results.   Original Report Authenticated By: Holley Dexter, M.D.    Review of Systems  Constitutional: Positive for malaise/fatigue.  HENT: Negative.   Eyes: Negative.  Respiratory: Negative.   Cardiovascular: Negative.   Gastrointestinal: Positive for abdominal pain and diarrhea.  Genitourinary: Negative.   Musculoskeletal: Negative.   Skin: Negative.   Neurological: Negative.   Endo/Heme/Allergies: Negative.    Psychiatric/Behavioral: Negative.     Blood pressure 120/93, pulse 88, temperature 98.6 F (37 C), temperature source Oral, resp. rate 16, last menstrual period 06/24/2013, SpO2 100.00%. Physical Exam  Constitutional: She is oriented to person, place, and time. She appears well-developed and well-nourished. No distress.  HENT:  Head: Normocephalic and atraumatic.  Nose: Nose normal.  Mouth/Throat: No oropharyngeal exudate.  Eyes: EOM are normal. Pupils are equal, round, and reactive to light. No scleral icterus.  Neck: Normal range of motion. Neck supple. No tracheal deviation present.  Cardiovascular: Normal rate, regular rhythm, normal heart sounds and intact distal pulses.   Respiratory: Effort normal and breath sounds normal. No stridor. No respiratory distress. She has no wheezes. She has no rales. She exhibits no tenderness.  GI: Soft. She exhibits no distension. There is tenderness. There is no rebound and no guarding.  Tender right lower quadrant and suprapubic region without guarding  Musculoskeletal: Normal range of motion.  Neurological: She is alert and oriented to person, place, and time. She exhibits normal muscle tone.  Skin: Skin is warm.  Psychiatric: She has a normal mood and affect.     Assessment/Plan Acute appendicitis: Will give intravenous antibiotics and take to the operating room for laparoscopic appendectomy. Procedure, risks, and benefits were discussed in detail the patient and her husband. She is agreeable.  Carlesha Seiple E 06/29/2013, 1:04 AM

## 2013-06-29 NOTE — Preoperative (Signed)
Beta Blockers   Reason not to administer Beta Blockers:Not Applicable 

## 2013-06-29 NOTE — Progress Notes (Addendum)
Day of Surgery  Subjective: 42 yo WF laying in bed, uncomfortable from abdominal pain. Husband standing at bedside. Pt states pain well controlled at a 3/10 with Percocet, but breakthrough pain (7/10) occurs within 3 hours of administration. Pain is located throughout the abdomen with emphasis in upper and right side of abdomen. C/o sore throat, had to crush Percocet and mix into apple sauce this AM.  Had clear liquids this AM and tolerated well. Denies any n/v. Pt would like to be discharged today but understands pain needs to be under control.   Objective: Vital signs in last 24 hours: Temp:  [97.6 F (36.4 C)-98.7 F (37.1 C)] 97.6 F (36.4 C) (07/04 1030) Pulse Rate:  [66-98] 66 (07/04 1030) Resp:  [13-20] 16 (07/04 1030) BP: (80-123)/(42-93) 84/58 mmHg (07/04 1030) SpO2:  [99 %-100 %] 100 % (07/04 1030) Weight:  [133 lb 13.1 oz (60.7 kg)] 133 lb 13.1 oz (60.7 kg) (07/04 0547)    Intake/Output from previous day: 07/03 0701 - 07/04 0700 In: 2000 [I.V.:2000] Out: 300 [Urine:300] Intake/Output this shift:    PE: Gen:  Alert, mildly distressed, pleasant Card:  RRR, no M/G/R heard Pulm:  CTA, no W/R/R Abd: Soft, tender to palpation throughout mostly on right side and upper quadrants, +BS, incisions C/D/I,  no abdominal scars noted  Lab Results:   Recent Labs  06/28/13 2113  WBC 15.8*  HGB 13.0  HCT 38.1  PLT 245   BMET  Recent Labs  06/28/13 2113  NA 136  K 3.3*  CL 101  CO2 25  GLUCOSE 122*  BUN 10  CREATININE 0.71  CALCIUM 9.1   PT/INR No results found for this basename: LABPROT, INR,  in the last 72 hours CMP     Component Value Date/Time   NA 136 06/28/2013 2113   K 3.3* 06/28/2013 2113   CL 101 06/28/2013 2113   CO2 25 06/28/2013 2113   GLUCOSE 122* 06/28/2013 2113   BUN 10 06/28/2013 2113   CREATININE 0.71 06/28/2013 2113   CALCIUM 9.1 06/28/2013 2113   PROT 7.0 06/28/2013 2113   ALBUMIN 3.8 06/28/2013 2113   AST 10 06/28/2013 2113   ALT 14 06/28/2013 2113   ALKPHOS 64 06/28/2013 2113   BILITOT 0.5 06/28/2013 2113   GFRNONAA >90 06/28/2013 2113   GFRAA >90 06/28/2013 2113   Lipase     Component Value Date/Time   LIPASE 21 06/28/2013 2113       Studies/Results: Ct Abdomen Pelvis W Contrast  06/29/2013   *RADIOLOGY REPORT*  Clinical Data: Lower abdominal pain, worse on the right.  CT ABDOMEN AND PELVIS WITH CONTRAST  Technique:  Multidetector CT imaging of the abdomen and pelvis was performed following the standard protocol during bolus administration of intravenous contrast.  Contrast: OMNIPAQUE IOHEXOL 300 MG/ML  SOLN, 50mL OMNIPAQUE IOHEXOL 300 MG/ML  SOLN  Comparison: None.  Findings: Lung bases are clear.  No pleural or pericardial effusion.  The gallbladder, liver, spleen, adrenal glands, pancreas, biliary tree and kidneys all appear normal.  The appendix is dilated at 1 cm with periappendiceal inflammatory change consistent with acute appendicitis.  No abscess or perforation is identified.  The stomach and small large bowel appear normal.  A small functional cyst is seen in the right ovary.  Adnexa are otherwise unremarkable.  Uterus and urinary bladder appear normal.  There is no lymphadenopathy.  No focal bony abnormality is identified.  IMPRESSION: Study is positive for acute appendicitis without abscess or  perforation.  Critical Value/emergent results were called by telephone at the time of interpretation on 06/29/2013 at 12:35 a.m. to Sharilyn Sites, P.A., who verbally acknowledged these results.   Original Report Authenticated By: Holley Dexter, M.D.    Anti-infectives: Anti-infectives   Start     Dose/Rate Route Frequency Ordered Stop   06/29/13 0145  [MAR Hold]  ciprofloxacin (CIPRO) IVPB 400 mg     (On MAR Hold since 06/29/13 0316)   400 mg 200 mL/hr over 60 Minutes Intravenous  Once 06/29/13 0109 06/29/13 0328   06/29/13 0145  metroNIDAZOLE (FLAGYL) IVPB 500 mg     500 mg 100 mL/hr over 60 Minutes Intravenous  Once 06/29/13 0109  06/29/13 0252       Assessment/Plan Acute Appendicitis S/p Lap Appendectomy POD #0  Pain Control    - Oxycodone IR and Toradol PRN  Antiemetics  SCDs, Ambulate, Heparin  Advance Diet to Full Liquids   LOS: 1 day    Cephus Shelling, PA-S 06/29/2013, 11:48 AM

## 2013-06-29 NOTE — Progress Notes (Signed)
Pt is extremely anxious Anesthesia aware Will give versed as ordered, and reassess

## 2013-06-29 NOTE — Progress Notes (Signed)
Pt resting quietly. Still somewhat anxious when aroused, but easily reassured.  Husband remains at bedside.

## 2013-06-29 NOTE — Progress Notes (Signed)
Pt tired/sore - needing IV meds Walked x 2 in hallways Trying sips Husband in room  General: Pt awakens in mild distress Eyes: PERRL, normal EOM. Sclera nonicteric Neuro: CN II-XII intact w/o focal sensory/motor deficits. Lymph: No head/neck/groin lymphadenopathy Psych:  No delerium/psychosis/paranoia HENT: Normocephalic, Mucus membranes moist.  No thrush Neck: Supple, No tracheal deviation Chest: No pain.  Good respiratory excursion. CV:  Pulses intact.  Regular rhythm MS: Normal AROM mjr joints.  No obvious deformity Abdomen: Soft, Nondistended.  TTP at RLQ>incisions.  No incarcerated hernias. Ext:  SCDs BLE.  No significant edema.  No cyanosis Skin: No petechiae / purpura  -improve pain control Adv diet Wean IVF  D/C patient from hospital when patient meets criteria (anticipate in 1-2 day(s)):  Tolerating oral intake well Ambulating in walkways Adequate pain control without IV medications Urinating  Having flatus   Suspect will be better tomorrow

## 2013-06-29 NOTE — Progress Notes (Signed)
See other note

## 2013-06-29 NOTE — Anesthesia Preprocedure Evaluation (Addendum)
Anesthesia Evaluation  Patient identified by MRN, date of birth, ID band Patient awake    Reviewed: Allergy & Precautions, H&P , NPO status , Patient's Chart, lab work & pertinent test results  Airway Mallampati: I TM Distance: >3 FB Neck ROM: Full    Dental  (+) Teeth Intact and Dental Advisory Given   Pulmonary          Cardiovascular     Neuro/Psych    GI/Hepatic Patient received Oral Contrast Agents,Acute appendicitis   Endo/Other    Renal/GU      Musculoskeletal   Abdominal   Peds  Hematology   Anesthesia Other Findings   Reproductive/Obstetrics                           Anesthesia Physical Anesthesia Plan  ASA: II and emergent  Anesthesia Plan: General   Post-op Pain Management:    Induction: Intravenous, Rapid sequence and Cricoid pressure planned  Airway Management Planned: Oral ETT  Additional Equipment:   Intra-op Plan:   Post-operative Plan: Extubation in OR  Informed Consent: I have reviewed the patients History and Physical, chart, labs and discussed the procedure including the risks, benefits and alternatives for the proposed anesthesia with the patient or authorized representative who has indicated his/her understanding and acceptance.     Plan Discussed with: CRNA and Surgeon  Anesthesia Plan Comments:         Anesthesia Quick Evaluation

## 2013-06-29 NOTE — Op Note (Signed)
06/28/2013 - 06/29/2013  4:02 AM  PATIENT:  Danielle Weeks  42 y.o. female  PRE-OPERATIVE DIAGNOSIS:  Acute Appendicitis  POST-OPERATIVE DIAGNOSIS:  Acute Appendicitis  PROCEDURE:  Procedure(s): APPENDECTOMY LAPAROSCOPIC  SURGEON:  Surgeon(s): Liz Malady, MD  PHYSICIAN ASSISTANT:   ASSISTANTS: none   ANESTHESIA:   local and general  EBL:  Total I/O In: 2000 [I.V.:2000] Out: 300 [Urine:300]  BLOOD ADMINISTERED:none  DRAINS: none   SPECIMEN:  Excision  DISPOSITION OF SPECIMEN:  PATHOLOGY  COUNTS:  YES  DICTATION: Reubin Milan Dictation  Patient is brought for appendectomy. She was identified in the preop holding area. She received intravenous antibiotics. Informed consent was obtained. She was brought to the operating room and general endotracheal anesthesia was a Optician, dispensing by the anesthesia staff. Foley catheter was placed by nursing. We did time out procedure. Abdomen was prepped and draped in sterile fashion. Infraumbilical region was infiltrated with quarter percent Marcaine with epinephrine. An umbilical incision was made. Subcutaneous tissues were dissected down revealing the anterior fascia. Was divided along the midline. Peritoneal cavity was entered under direct vision. 0 Vicryl pursestring suture was placed on the fascial opening. Hassan trocar was inserted. Abdomen was insufflated with carbon dioxide in standard fashion. Under direct vision, a 12 mm left lower corner and a 5 mm right mid abdomen port were placed. Local was used at each port site. Exploration revealed an inflamed but not perforated appendix. Mesoappendix was divided with the harmonic scalpel achieving excellent hemostasis. Base of the Appendix was divided with Endo GIA with vascular load. Excellent staple line was achieved. Appendix was placed in Endo Catch bag and removed from the abdomen via the left lower corner port site. Abdomen was irrigated. There was no bleeding. Staple line was intact. Ports were  removed under direct vision. Pneumoperitoneum was released. Infraumbilical fascia was closed by tying the pursestring suture. All 3 wounds were copiously irrigated and the skin of each was closed with running 4 Vicryl followed by Dermabond. All counts were correct. Patient tolerated procedure well apparent complication was taken recovery in stable condition.  PATIENT DISPOSITION:  PACU - hemodynamically stable.   Delay start of Pharmacological VTE agent (>24hrs) due to surgical blood loss or risk of bleeding:  no  Violeta Gelinas, MD, MPH, FACS Pager: [phone]  7/4/20144:02 AM

## 2013-06-29 NOTE — ED Notes (Signed)
While RN was obtaining consent for procedure, pt had questions about risks for procedures. RN walked pt through the procedure and explained the risks and benefits of the procedure. Pt verbalized understanding and had no further questions. Pt states she is more relieved now.

## 2013-06-29 NOTE — Anesthesia Procedure Notes (Signed)
Procedure Name: Intubation Date/Time: 06/29/2013 3:24 AM Performed by: Molli Hazard Pre-anesthesia Checklist: Patient identified, Emergency Drugs available, Suction available and Patient being monitored Patient Re-evaluated:Patient Re-evaluated prior to inductionOxygen Delivery Method: Circle system utilized Preoxygenation: Pre-oxygenation with 100% oxygen Intubation Type: IV induction, Rapid sequence and Cricoid Pressure applied Laryngoscope Size: Miller and 2 Grade View: Grade I Tube type: Oral Tube size: 7.5 mm Number of attempts: 1 Airway Equipment and Method: Stylet Placement Confirmation: ETT inserted through vocal cords under direct vision,  positive ETCO2 and breath sounds checked- equal and bilateral Secured at: 21 cm Tube secured with: Tape Dental Injury: Teeth and Oropharynx as per pre-operative assessment

## 2013-06-29 NOTE — ED Notes (Signed)
Surgeon at bedside.  

## 2013-06-29 NOTE — Progress Notes (Signed)
UR completed 

## 2013-06-30 MED ORDER — ONDANSETRON HCL 4 MG PO TABS: 4.0000 mg | ORAL_TABLET | Freq: Three times a day (TID) | ORAL | Status: DC | PRN

## 2013-06-30 MED ORDER — TRAMADOL HCL 50 MG PO TABS: 50.0000 mg | ORAL_TABLET | Freq: Four times a day (QID) | ORAL | Status: DC | PRN

## 2013-06-30 NOTE — Progress Notes (Signed)
Discharge instructions gone over with patient. Home medications gone over. Prescriptions given. Diet, activity, incisional care, and signs and symptoms of infection gone over. Follow up appointment to be made. Patient verbalized understanding of instructions and will think about my chart information.

## 2013-06-30 NOTE — Discharge Summary (Signed)
Physician Discharge Summary  Patient ID:  Danielle Weeks  MRN: [id number]  DOB/AGE: April 09, 1971 42 y.o.  Admit date: 06/28/2013 Discharge date: 06/30/2013  Discharge Diagnoses:  1.    Acute appendicitis    Operation: Procedure(s): APPENDECTOMY LAPAROSCOPIC on 7//2014   Discharged Condition: good  Hospital Course: Danielle Weeks is an 42 y.o. female whose primary care physician is Danielle Covey, MD and who was admitted 06/28/2013 with a chief complaint of abdominal pain/appendicitis.   She was brought to the operating room on 06/29/2013 and underwent  APPENDECTOMY LAPAROSCOPIC .   She was not ready to go home yesterday. She has had some nausea today, but she is still anxious to go home.  Her husband is in the room with her.  The discharge instructions were reviewed with the patient.  Consults: None  Significant Diagnostic Studies: Results for orders placed during the hospital encounter of 06/28/13  CBC WITH DIFFERENTIAL      Result Value Range   WBC 15.8 (*) 4.0 - 10.5 K/uL   RBC 4.45  3.87 - 5.11 MIL/uL   Hemoglobin 13.0  12.0 - 15.0 g/dL   HCT 40.9  81.1 - 91.4 %   MCV 85.6  78.0 - 100.0 fL   MCH 29.2  26.0 - 34.0 pg   MCHC 34.1  30.0 - 36.0 g/dL   RDW 78.2  95.6 - 21.3 %   Platelets 245  150 - 400 K/uL   Neutrophils Relative % 78 (*) 43 - 77 %   Neutro Abs 12.2 (*) 1.7 - 7.7 K/uL   Lymphocytes Relative 15  12 - 46 %   Lymphs Abs 2.3  0.7 - 4.0 K/uL   Monocytes Relative 6  3 - 12 %   Monocytes Absolute 1.0  0.1 - 1.0 K/uL   Eosinophils Relative 2  0 - 5 %   Eosinophils Absolute 0.2  0.0 - 0.7 K/uL   Basophils Relative 0  0 - 1 %   Basophils Absolute 0.0  0.0 - 0.1 K/uL  COMPREHENSIVE METABOLIC PANEL      Result Value Range   Sodium 136  135 - 145 mEq/L   Potassium 3.3 (*) 3.5 - 5.1 mEq/L   Chloride 101  96 - 112 mEq/L   CO2 25  19 - 32 mEq/L   Glucose, Bld 122 (*) 70 - 99 mg/dL   BUN 10  6 - 23 mg/dL   Creatinine, Ser 0.86  0.50 - 1.10 mg/dL   Calcium 9.1  8.4 -  57.8 mg/dL   Total Protein 7.0  6.0 - 8.3 g/dL   Albumin 3.8  3.5 - 5.2 g/dL   AST 10  0 - 37 U/L   ALT 14  0 - 35 U/L   Alkaline Phosphatase 64  39 - 117 U/L   Total Bilirubin 0.5  0.3 - 1.2 mg/dL   GFR calc non Af Amer >90  >90 mL/min   GFR calc Af Amer >90  >90 mL/min  LIPASE, BLOOD      Result Value Range   Lipase 21  11 - 59 U/L  URINALYSIS, ROUTINE W REFLEX MICROSCOPIC      Result Value Range   Color, Urine YELLOW  YELLOW   APPearance CLEAR  CLEAR   Specific Gravity, Urine 1.020  1.005 - 1.030   pH 8.0  5.0 - 8.0   Glucose, UA NEGATIVE  NEGATIVE mg/dL   Hgb urine dipstick NEGATIVE  NEGATIVE   Bilirubin Urine  NEGATIVE  NEGATIVE   Ketones, ur 15 (*) NEGATIVE mg/dL   Protein, ur NEGATIVE  NEGATIVE mg/dL   Urobilinogen, UA 0.2  0.0 - 1.0 mg/dL   Nitrite NEGATIVE  NEGATIVE   Leukocytes, UA NEGATIVE  NEGATIVE  POCT PREGNANCY, URINE      Result Value Range   Preg Test, Ur NEGATIVE  NEGATIVE    Ct Abdomen Pelvis W Contrast  06/29/2013   *RADIOLOGY REPORT*  Clinical Data: Lower abdominal pain, worse on the right.  CT ABDOMEN AND PELVIS WITH CONTRAST  Technique:  Multidetector CT imaging of the abdomen and pelvis was performed following the standard protocol during bolus administration of intravenous contrast.  Contrast: OMNIPAQUE IOHEXOL 300 MG/ML  SOLN, 50mL OMNIPAQUE IOHEXOL 300 MG/ML  SOLN  Comparison: None.  Findings: Lung bases are clear.  No pleural or pericardial effusion.  The gallbladder, liver, spleen, adrenal glands, pancreas, biliary tree and kidneys all appear normal.  The appendix is dilated at 1 cm with periappendiceal inflammatory change consistent with acute appendicitis.  No abscess or perforation is identified.  The stomach and small large bowel appear normal.  A small functional cyst is seen in the right ovary.  Adnexa are otherwise unremarkable.  Uterus and urinary bladder appear normal.  There is no lymphadenopathy.  No focal bony abnormality is identified.   IMPRESSION: Study is positive for acute appendicitis without abscess or perforation.  Critical Value/emergent results were called by telephone at the time of interpretation on 06/29/2013 at 12:35 a.m. to Sharilyn Sites, P.A., who verbally acknowledged these results.   Original Report Authenticated By: Holley Dexter, M.D.    Discharge Exam:  Filed Vitals:   06/30/13 0534  BP: 91/48  Pulse: 77  Temp: 97.9 F (36.6 C)  Resp: 16    General: WN WF who is alert and generally healthy appearing.  Lungs: Clear to auscultation and symmetric breath sounds. Heart:  RRR. No murmur or rub. Abdomen: Soft. Incisions look okay. Normal bowel sounds.   Discharge Medications:     Medication List    ASK your doctor about these medications       nitrofurantoin (macrocrystal-monohydrate) 100 MG capsule  Commonly known as:  MACROBID  Take 100 mg by mouth as needed (after intercourse to prevent uti).       Disposition:    Activity:  Driving - May drive in 2 or 3 days, if doing well   Lifting - No lifting > 15 pounds for 7 days, then no limit  Wound Care:   May shower.           No public water (lakes, pools, etc.) until seen post op.  Diet:  As tolerated.  Drink plenty of fluid.  Follow up appointment:  Call Dr. Carollee Massed office [facility]) at [phone] for an appointment in 2 to 3 weeks.  Medications and dosages:  Resume your home medications.  You have a prescription for:  Tramadol for pain and Zofran for nausea.   Signed: Ovidio Kin, M.D., FACS  06/30/2013, 9:51 AM

## 2013-06-30 NOTE — Discharge Instructions (Signed)
[facility] - DISCHARGE INSTRUCTIONS TO PATIENT  Activity:  Driving - May drive in 2 or 3 days, if doing well   Lifting - No lifting > 15 pounds for 7 days, then no limit  Wound Care:   May shower.           No public water (lakes, pools, etc.) until seen post op.  Diet:  As tolerated.  Drink plenty of fluid.  Follow up appointment:  Call Dr. Carollee Massed office [facility]) at [phone] for an appointment in 2 to 3 weeks.  Medications and dosages:  Resume your home medications.  You have a prescription for:  Tramadol for pain and Zofran for nausea.  Call Dr. Janee Morn or his office  [phone]) if you have:  Temperature greater than 100.4,  Persistent nausea and vomiting,  Severe uncontrolled pain,  Redness, tenderness, or signs of infection (pain, swelling, redness, odor or green/yellow discharge around the site),  Difficulty breathing, headache or visual disturbances,  Any other questions or concerns you may have after discharge.  In an emergency, call 911 or go to an Emergency Department at a nearby hospital.

## 2013-07-02 ENCOUNTER — Encounter (HOSPITAL_COMMUNITY): Payer: Self-pay | Admitting: General Surgery

## 2013-07-02 ENCOUNTER — Telehealth: Payer: Self-pay | Admitting: Family Medicine

## 2013-07-02 NOTE — ED Provider Notes (Signed)
Medical screening examination/treatment/procedure(s) were performed by non-physician practitioner and as supervising physician I was immediately available for consultation/collaboration.  Flint Melter, MD 07/02/13 2156

## 2013-07-02 NOTE — Telephone Encounter (Signed)
[facility] Triage Call Report Triage Record Num: [id number] Operator: Alphonsa Overall Patient Name: Danielle Weeks Call Date & Time: 06/28/2013 5:53:13PM Patient Phone: [phone] PCP: Evelena Peat Patient Gender: Female PCP Fax : [phone] Patient DOB: November 06, 1971 Practice Name: [facility] Reason for Call: LMP 06/23/13. [name] calling about abdominal pain. Onset 06/28/13 at lunch. Upper abdomen under ribs, tender to touch. Afebrile. More stooling 06/28/13- 1 looser. 7/10 pain level. Pain deep. See ED immediately. Care advice given per Abdominal pain Protocol. Pt advised to go to [facility] ED, pt will look at Urgent Care first. Protocol(s) Used: Abdominal Pain Recommended Outcome per Protocol: See ED Immediately Reason for Outcome: Pain described as deep, boring, or tearing Care Advice: ~ Another adult should drive. ~ Do not give the patient anything to eat or drink. ~ Do not push on abdomen. ~ IMMEDIATE ACTION Write down provider's name. List or place the following in a bag for transport with the patient: current prescription and/or nonprescription medications; alternative treatments, therapies and medications; and street drugs. ~ 07/

## 2013-07-05 ENCOUNTER — Telehealth (INDEPENDENT_AMBULATORY_CARE_PROVIDER_SITE_OTHER): Payer: Self-pay | Admitting: General Surgery

## 2013-07-05 NOTE — Telephone Encounter (Signed)
Patient calling to see if it is still okay that she has some tenderness at her incisions, especially the one on the left. She is having no severe pain. No nausea/vomiting. No fevers. No problems with incisions. I made patient aware a week from surgery soreness is still very normal. She will take aleve as needed because pain medication makes her sick to her stomach and call if any new symptoms.

## 2013-07-16 ENCOUNTER — Telehealth (INDEPENDENT_AMBULATORY_CARE_PROVIDER_SITE_OTHER): Payer: Self-pay

## 2013-07-16 NOTE — Telephone Encounter (Signed)
Message copied by Ivory Broad on Mon Jul 16, 2013  4:17 PM ------      Message from: Rise Paganini      Created: Mon Jul 16, 2013  4:10 PM      Regarding: Thompson      Contact: [phone]       Patient stated that she didn't expect her scars to be this size. She would like to know if there's a cream that she can apply. Appointment on 07/18/13. Please call to discuss. Thank you. ------

## 2013-07-16 NOTE — Telephone Encounter (Signed)
I called the pt back.  I advised her to wait until 4 weeks out and she can use Vitamin E cream, Cocoa Butter or Mederma.  She asked about using Neosporin.  I advised not to.  I said keep it clean and dry.  I told her Dr Janee Morn will check her incisions Wednesday and make sure they are healing.

## 2013-07-18 ENCOUNTER — Encounter (INDEPENDENT_AMBULATORY_CARE_PROVIDER_SITE_OTHER): Payer: Self-pay | Admitting: General Surgery

## 2013-07-18 ENCOUNTER — Ambulatory Visit (INDEPENDENT_AMBULATORY_CARE_PROVIDER_SITE_OTHER): Payer: 59 | Admitting: General Surgery

## 2013-07-18 ENCOUNTER — Telehealth (INDEPENDENT_AMBULATORY_CARE_PROVIDER_SITE_OTHER): Payer: Self-pay | Admitting: *Deleted

## 2013-07-18 VITALS — BP 100/58 | HR 72 | Temp 97.9°F | Resp 15 | Ht 65.0 in | Wt 133.2 lb

## 2013-07-18 DIAGNOSIS — Z9889 Other specified postprocedural states: Secondary | ICD-10-CM

## 2013-07-18 DIAGNOSIS — Z9049 Acquired absence of other specified parts of digestive tract: Secondary | ICD-10-CM

## 2013-07-18 NOTE — Telephone Encounter (Signed)
Patient called to ask if she can go swimming in the ocean and swimming pools at this time.  Awaiting response from Dr. Janee Morn at this time.  Dr. Janee Morn said yes it is fine for patient to be in water at this time.

## 2013-07-18 NOTE — Progress Notes (Signed)
Subjective:     Patient ID: Danielle Weeks, female   DOB: 21-Jan-1971, 42 y.o.   MRN: [id number]  HPI Patient is status post laparoscopic appendectomy. She is feeling much better. She is not taking any pain medication. She had some discomfort at the left lower quadrant port site but this has resolved.  Review of Systems     Objective:   Physical Exam Abdomen soft and nontender, all 3 incisions healing well without signs of infection    Assessment:     Status post arthroscopic appendectomy and doing well    Plan:     Pathology was reviewed with her. This showed acute appendicitis without perforation. No malignancy was seen. She will avoid heavy lifting for a total of 4 weeks after surgery. Return when necessary.

## 2013-10-24 ENCOUNTER — Other Ambulatory Visit (INDEPENDENT_AMBULATORY_CARE_PROVIDER_SITE_OTHER): Payer: 59

## 2013-10-24 DIAGNOSIS — Z0000 Encounter for general adult medical examination without abnormal findings: Secondary | ICD-10-CM

## 2013-10-24 LAB — LIPID PANEL
Cholesterol: 143 mg/dL (ref 0–200)
HDL: 75.3 mg/dL (ref >39.00)
LDL Cholesterol: 57 mg/dL (ref 0–99)
VLDL: 11.2 mg/dL (ref 0.0–40.0)

## 2013-10-24 LAB — BASIC METABOLIC PANEL
CO2: 30 mEq/L (ref 19–32)
Calcium: 9.3 mg/dL (ref 8.4–10.5)
Creatinine, Ser: 0.6 mg/dL (ref 0.4–1.2)
GFR: 120.31 mL/min (ref >60.00)
Sodium: 139 mEq/L (ref 135–145)

## 2013-10-24 LAB — CBC WITH DIFFERENTIAL/PLATELET
Basophils Absolute: 0 10*3/uL (ref 0.0–0.1)
Basophils Relative: 0.6 % (ref 0.0–3.0)
Eosinophils Absolute: 0.2 10*3/uL (ref 0.0–0.7)
Lymphocytes Relative: 40.5 % (ref 12.0–46.0)
MCHC: 33.7 g/dL (ref 30.0–36.0)
Monocytes Relative: 7.7 % (ref 3.0–12.0)
Neutrophils Relative %: 47 % (ref 43.0–77.0)
RBC: 4.45 Mil/uL (ref 3.87–5.11)
RDW: 14.5 % (ref 11.5–14.6)

## 2013-10-24 LAB — POCT URINALYSIS DIPSTICK
Bilirubin, UA: NEGATIVE
Blood, UA: NEGATIVE
Ketones, UA: NEGATIVE
Leukocytes, UA: NEGATIVE
Protein, UA: NEGATIVE
Spec Grav, UA: 1.01
Urobilinogen, UA: 0.2
pH, UA: 7.5

## 2013-10-24 LAB — HEPATIC FUNCTION PANEL
AST: 10 U/L (ref 0–37)
Alkaline Phosphatase: 59 U/L (ref 39–117)
Bilirubin, Direct: 0 mg/dL (ref 0.0–0.3)

## 2013-10-25 LAB — VITAMIN D 25 HYDROXY (VIT D DEFICIENCY, FRACTURES): Vit D, 25-Hydroxy: 39 ng/mL (ref 30–89)

## 2013-11-02 ENCOUNTER — Encounter: Payer: 59 | Admitting: Family Medicine

## 2013-11-08 ENCOUNTER — Encounter: Payer: 59 | Admitting: Family Medicine

## 2013-12-05 ENCOUNTER — Encounter: Payer: Self-pay | Admitting: Family Medicine

## 2013-12-05 ENCOUNTER — Ambulatory Visit (INDEPENDENT_AMBULATORY_CARE_PROVIDER_SITE_OTHER): Payer: 59 | Admitting: Family Medicine

## 2013-12-05 VITALS — BP 90/60 | HR 73 | Temp 98.6°F | Ht 65.0 in | Wt 139.0 lb

## 2013-12-05 DIAGNOSIS — Z0000 Encounter for general adult medical examination without abnormal findings: Secondary | ICD-10-CM

## 2013-12-05 DIAGNOSIS — Z23 Encounter for immunization: Secondary | ICD-10-CM

## 2013-12-05 NOTE — Progress Notes (Signed)
   Subjective:    Patient ID: Danielle Weeks, female    DOB: 01-13-1971, 42 y.o.   MRN: [id number]  HPI  Patient seen for complete physical. She sees gynecologist regularly. She is generally very healthy. Takes no regular medications. Last tetanus over 10 years ago. She has not had flu vaccine yet. She does not exercise regularly. Does not take any regular supplements. She had laparoscopic appendectomy last summer secondary to acute appendicitis. She's done well since then.  Past Medical History  Diagnosis Date  . Chicken pox   . UTI (lower urinary tract infection)   . TMJ (dislocation of temporomandibular joint)   . ASCUS (atypical squamous cells of undetermined significance) on Pap smear     biopsied twice   Past Surgical History  Procedure Laterality Date  . Laparoscopic appendectomy N/A 06/29/2013    Procedure: APPENDECTOMY LAPAROSCOPIC;  Surgeon: Liz Malady, MD;  Location: [facility] OR;  Service: General;  Laterality: N/A;    reports that she has never smoked. She has never used smokeless tobacco. She reports that she drinks alcohol. She reports that she does not use illicit drugs. family history includes Alcohol abuse in her maternal grandfather; Birth defects in her maternal grandmother; Cancer in her father and maternal grandmother; Leukemia in her paternal grandfather. Allergies  Allergen Reactions  . Penicillins     GI upset      Review of Systems  Constitutional: Negative for fever, activity change, appetite change, fatigue and unexpected weight change.  HENT: Negative for ear pain, hearing loss, sore throat and trouble swallowing.   Eyes: Negative for visual disturbance.  Respiratory: Negative for cough and shortness of breath.   Cardiovascular: Negative for chest pain and palpitations.  Gastrointestinal: Negative for abdominal pain, diarrhea, constipation and blood in stool.  Genitourinary: Negative for dysuria and hematuria.  Musculoskeletal: Negative for  arthralgias, back pain and myalgias.  Skin: Negative for rash.  Neurological: Negative for dizziness, syncope and headaches.  Hematological: Negative for adenopathy.  Psychiatric/Behavioral: Negative for confusion and dysphoric mood.       Objective:   Physical Exam  Constitutional: She is oriented to person, place, and time. She appears well-developed and well-nourished.  HENT:  Head: Normocephalic and atraumatic.  Eyes: EOM are normal. Pupils are equal, round, and reactive to light.  Neck: Normal range of motion. Neck supple. No thyromegaly present.  Cardiovascular: Normal rate, regular rhythm and normal heart sounds.   No murmur heard. Pulmonary/Chest: Breath sounds normal. No respiratory distress. She has no wheezes. She has no rales.  Abdominal: Soft. Bowel sounds are normal. She exhibits no distension and no mass. There is no tenderness. There is no rebound and no guarding.  Genitourinary:  Per gyn  Musculoskeletal: Normal range of motion. She exhibits no edema.  Lymphadenopathy:    She has no cervical adenopathy.  Neurological: She is alert and oriented to person, place, and time. She displays normal reflexes. No cranial nerve deficit.  Skin: No rash noted.  Psychiatric: She has a normal mood and affect. Her behavior is normal. Judgment and thought content normal.          Assessment & Plan:  Complete physical. FluMist requested and will be administered if available. Tetanus booster given. We discussed regular exercise.  Labs reviewed with patient with no major concerns

## 2014-03-01 ENCOUNTER — Other Ambulatory Visit: Payer: Self-pay

## 2014-03-01 DIAGNOSIS — Z1231 Encounter for screening mammogram for malignant neoplasm of breast: Secondary | ICD-10-CM

## 2014-03-19 ENCOUNTER — Ambulatory Visit: Payer: 59

## 2014-03-27 ENCOUNTER — Ambulatory Visit
Admission: RE | Admit: 2014-03-27 | Discharge: 2014-03-27 | Disposition: A | Discharge status: Home / Self Care | Payer: 59 | Source: Ambulatory Visit

## 2014-03-27 ENCOUNTER — Other Ambulatory Visit: Payer: Self-pay

## 2014-03-27 DIAGNOSIS — Z1231 Encounter for screening mammogram for malignant neoplasm of breast: Secondary | ICD-10-CM

## 2015-02-28 ENCOUNTER — Other Ambulatory Visit: Payer: Self-pay

## 2015-02-28 DIAGNOSIS — Z1231 Encounter for screening mammogram for malignant neoplasm of breast: Secondary | ICD-10-CM

## 2015-03-31 ENCOUNTER — Ambulatory Visit: Payer: Self-pay

## 2015-03-31 ENCOUNTER — Ambulatory Visit (INDEPENDENT_AMBULATORY_CARE_PROVIDER_SITE_OTHER): Payer: Self-pay | Admitting: Internal Medicine

## 2015-03-31 ENCOUNTER — Telehealth: Payer: Self-pay

## 2015-03-31 ENCOUNTER — Encounter: Payer: Self-pay | Admitting: Internal Medicine

## 2015-03-31 VITALS — BP 84/50 | HR 78 | Temp 98.4°F | Wt 135.8 lb

## 2015-03-31 DIAGNOSIS — R6889 Other general symptoms and signs: Secondary | ICD-10-CM

## 2015-03-31 NOTE — Progress Notes (Signed)
Pre visit review using our clinic review tool, if applicable. No additional management support is needed unless otherwise documented below in the visit note.  Chief Complaint  Patient presents with  . Cough    Started Friday   . Headache  . Generalized Body Aches  . Chills    HPI: Patient Danielle Weeks  comes in today for SDA for  new problem evaluation. Last week was in [place] and flew home  And 3 days  ago  Had  Cough and bad odor  .   In pollen season  Go cold and chilled  2-3 days   ocass comfortable   Feels  Body aches .   Temp not taken  Headaches ache  Hurts   Dull behind eyes not severe .   Says not concentrating well  Cold med   No allergy.  ?  No one else sick  Had diarrhea   This am .  Not eating but  Apple juice .  Water.   ROS: See pertinent positives and negatives per HPI. bp runs low 90 is normal for her no dizziness or syncope  No period issues  Has 2 young kids at home  Past Medical History  Diagnosis Date  . Chicken pox   . UTI (lower urinary tract infection)   . TMJ (dislocation of temporomandibular joint)   . ASCUS (atypical squamous cells of undetermined significance) on Pap smear     biopsied twice    Family History  Problem Relation Age of Onset  . Cancer Maternal Grandmother   . Birth defects Maternal Grandmother     colon  . Alcohol abuse Maternal Grandfather   . Leukemia Paternal Grandfather   . Cancer Father     acoustic neuroma and melanoma    History   Social History  . Marital Status: Married    Spouse Name: N/A  . Number of Children: N/A  . Years of Education: N/A   Social History Main Topics  . Smoking status: Never Smoker   . Smokeless tobacco: Never Used  . Alcohol Use: Yes     Comment: ocasiona;  Marland Kitchen Drug Use: No  . Sexual Activity: Not on file   Other Topics Concern  . None   Social History Narrative    Outpatient Encounter Prescriptions as of 03/31/2015  Medication Sig  . [DISCONTINUED] nitrofurantoin,  macrocrystal-monohydrate, (MACROBID) 100 MG capsule Take 100 mg by mouth as needed (after intercourse to prevent uti).     EXAM:  BP 84/50 mmHg  Temp(Src) 98.4 F (36.9 C) (Oral)  Wt 135 lb 12.8 oz (61.598 kg)  Body mass index is 22.6 kg/(m^2).  GENERAL: vitals reviewed and listed above, alert, oriented, appears well hydrated and in no acute distress non toxic  Doesn't feel well.   Not acute  HEENT: atraumatic, conjunctiva  clear, no obvious abnormalities on inspection of external nose and ears OP : no lesion edema or exudate  NECK: no obvious masses on inspection palpation  LUNGS: clear to auscultation bilaterally, no wheezes, rales or rhonchi, good air movement CV: HRRR, no clubbing cyanosis or  peripheral edema nl cap refill  Abdomen:  Sof,t normal bowel sounds without hepatosplenomegaly, no guarding rebound or masses no CVA tenderness Neuro nl speech non focal  Nl thought  MS: moves all extremities without noticeable focal  abnormality PSYCH: pleasant and cooperative,  Skin: normal capillary refill ,turgor , color: No acute rashes ,petechiae or bruising  ASSESSMENT AND PLAN:  Discussed the  following assessment and plan:  Flu-like symptoms acts like flu   But no documented fever  ( not taken) Says low bp  90 range is normal for her although  Today some lower than usually no perfusion related  Findings .on exam  Follow hydrate  And     Expectant management. Return for alarm features etc  loos stool today prob from juice intake .  -Patient advised to return or notify health care team  if symptoms worsen ,persist or new concerns arise.  Patient Instructions   This acts   Like   Flu like illness and should get better   With time  Caution with the  Medication. To cause side effects also . Fluids rest. Fu if fever lasting 5 and over days  Shortness of breath . Sever pain  Etc. Or relapsing sx .     Influenza Influenza ("the flu") is a viral infection of the respiratory  tract. It occurs more often in winter months because people spend more time in close contact with one another. Influenza can make you feel very sick. Influenza easily spreads from person to person (contagious). CAUSES  Influenza is caused by a virus that infects the respiratory tract. You can catch the virus by breathing in droplets from an infected person's cough or sneeze. You can also catch the virus by touching something that was recently contaminated with the virus and then touching your mouth, nose, or eyes. RISKS AND COMPLICATIONS You may be at risk for a more severe case of influenza if you smoke cigarettes, have diabetes, have chronic heart disease (such as heart failure) or lung disease (such as asthma), or if you have a weakened immune system. Elderly people and pregnant women are also at risk for more serious infections. The most common problem of influenza is a lung infection (pneumonia). Sometimes, this problem can require emergency medical care and may be life threatening. SIGNS AND SYMPTOMS  Symptoms typically last 4 to 10 days and may include:  Fever.  Chills.  Headache, body aches, and muscle aches.  Sore throat.  Chest discomfort and cough.  Poor appetite.  Weakness or feeling tired.  Dizziness.  Nausea or vomiting. DIAGNOSIS  Diagnosis of influenza is often made based on your history and a physical exam. A nose or throat swab test can be done to confirm the diagnosis. TREATMENT  In mild cases, influenza goes away on its own. Treatment is directed at relieving symptoms. For more severe cases, your health care provider may prescribe antiviral medicines to shorten the sickness. Antibiotic medicines are not effective because the infection is caused by a virus, not by bacteria. HOME CARE INSTRUCTIONS  Take medicines only as directed by your health care provider.  Use a cool mist humidifier to make breathing easier.  Get plenty of rest until your temperature returns  to normal. This usually takes 3 to 4 days.  Drink enough fluid to keep your urine clear or pale yellow.  Cover yourmouth and nosewhen coughing or sneezing,and wash your handswellto prevent thevirusfrom spreading.  Stay homefromwork orschool untilthe fever is gonefor at least 46full day. PREVENTION  An annual influenza vaccination (flu shot) is the best way to avoid getting influenza. An annual flu shot is now routinely recommended for all adults in the [place] IF:  You experiencechest pain, yourcough worsens,or you producemore mucus.  Youhave nausea,vomiting, ordiarrhea.  Your fever returns or gets worse. SEEK IMMEDIATE MEDICAL CARE IF:  You havetrouble breathing, you become  short of breath,or your skin ornails becomebluish.  You have severe painor stiffnessin the neck.  You develop a sudden headache, or pain in the face or ear.  You have nausea or vomiting that you cannot control. MAKE SURE YOU:   Understand these instructions.  Will watch your condition.  Will get help right away if you are not doing well or get worse. Document Released: 12/10/2000 Document Revised: 04/29/2014 Document Reviewed: 03/13/2012 [facility] Patient Information 2015 [place], [place]. This information is not intended to replace advice given to you by your health care provider. Make sure you discuss any questions you have with your health care provider.      Standley Brooking. Panosh M.D.

## 2015-03-31 NOTE — Telephone Encounter (Signed)
PLEASE NOTE: All timestamps contained within this report are represented as [place] Standard Time. CONFIDENTIALTY NOTICE: This fax transmission is intended only for the addressee. It contains information that is legally privileged, confidential or otherwise protected from use or disclosure. If you are not the intended recipient, you are strictly prohibited from reviewing, disclosing, copying using or disseminating any of this information or taking any action in reliance on or regarding this information. If you have received this fax in error, please notify us immediately by telephone so that we can arrange for its return to [place]. Phone: [phone], Toll-Free: [phone], Fax: [phone] Page: 1 of 2 Call Id: [id number] [facility] Night - Client [place] Patient Name: Danielle Weeks Gender: Female DOB: [birth year] Age: 44 Y 11 M 4 D Return Phone Number: [phone] (Primary) Address: [address] City/State/Zip: [place] [address] Client [facility] Night - Client Client Site [facility] - Night Physician Carolann Littler Contact Type Call Call Type Triage / Clinical Relationship To Patient Self Return Phone Number [phone] (Primary) Chief Complaint Cold Symptom Initial Comment Caller states she has chest congestion, chills, and fatigued. PreDisposition Call Doctor Nurse Assessment Nurse: Justine Null, RN, Rodena Piety Date/Time Eilene Ghazi Time): 03/30/2015 1:43:26 PM Confirm and document reason for call. If symptomatic, describe symptoms. ---Caller states she has chest congestion, chills, and fatigued. caller stated that she has a no runny nose and has cough with congestion yellow phlegm and has had possibly fever but did not take the temp and has and has had chills and fells tired and had aches with her back and legs and unsure about flu exposure and just don't feel well Able to  take fluids fairly well appetite is decreased and no vomiting or diarrhea no resp distress Has the patient traveled out of the country within the last 30 days? ---No Does the patient require triage? ---Yes Related visit to physician within the last 2 weeks? ---No Does the PT have any chronic conditions? (i.e. diabetes, asthma, etc.) ---No Did the patient indicate they were pregnant? ---No Guidelines Guideline Title Affirmed Question Affirmed Notes Nurse Date/Time (Eastern Time) Cough - Acute Productive Fever present > 3 days (72 hours) Justine Null, RN, Rodena Piety 03/30/2015 1:46:59 PM Disp. Time Eilene Ghazi Time) Disposition Final User 03/30/2015 1:49:52 PM See Physician within 24 Hours Yes Justine Null, RN, Inis Sizer Understands: Yes Disagree/Comply: Comply PLEASE NOTE: All timestamps contained within this report are represented as [place] Standard Time. CONFIDENTIALTY NOTICE: This fax transmission is intended only for the addressee. It contains information that is legally privileged, confidential or otherwise protected from use or disclosure. If you are not the intended recipient, you are strictly prohibited from reviewing, disclosing, copying using or disseminating any of this information or taking any action in reliance on or regarding this information. If you have received this fax in error, please notify us immediately by telephone so that we can arrange for its return to [place]. Phone: [phone], Toll-Free: [phone], Fax: [phone] Page: 2 of 2 Call Id: [id number] Care Advice Given Per Guideline SEE PHYSICIAN WITHIN 24 HOURS: * IF OFFICE WILL BE OPEN: You need to be examined within the next 24 hours. Call your doctor when the office opens, and make an appointment. FEVER MEDICINES: * For fever relief, take acetaminophen or ibuprofen. * Treat fevers above 101 F (38.3 C). * The goal of fever therapy is to bring the fever down to a comfortable level. Remember that fever medicine usually lowers fever  2-3 F (1-1.5 C). ACETAMINOPHEN (E.G., TYLENOL): * Take 650 mg (two 325 mg pills) by mouth every 4-6 hours as needed. Each Regular Strength Tylenol pill has 325 mg of acetaminophen. The most you should take each day is 3,250 mg (10 Regular Strength pills a day). COUGH DROPS FOR COUGH: * Cough drops can help a lot, especially for mild coughs. They reduce coughing by soothing your irritated throat and removing that tickle sensation in the back of the throat. * Cough drops also have the advantage of portability - you can carry them with you. * Cough drops are available overthe- counter (OTC). HOME REMEDY - HARD CANDY: Hard candy works just as well as a medicine-flavored OTC cough drops. COUGHING SPELLS: * Drink warm fluids. Inhale warm mist. (Reason: both relax the airway and loosen up the phlegm) * Suck on cough drops or hard candy to coat the irritated throat. CALL BACK IF: * Difficulty breathing occurs * You become worse. CARE ADVICE given per Cough - Acute Productive (Adult) guideline. After Care Instructions Given Call Event Type User Date / Time Description Referrals REFERRED TO PCP OFFICE

## 2015-03-31 NOTE — Patient Instructions (Signed)
This acts   Like   Flu like illness and should get better   With time  Caution with the  Medication. To cause side effects also . Fluids rest. Fu if fever lasting 5 and over days  Shortness of breath . Sever pain  Etc. Or relapsing sx .     Influenza Influenza ("the flu") is a viral infection of the respiratory tract. It occurs more often in winter months because people spend more time in close contact with one another. Influenza can make you feel very sick. Influenza easily spreads from person to person (contagious). CAUSES  Influenza is caused by a virus that infects the respiratory tract. You can catch the virus by breathing in droplets from an infected person's cough or sneeze. You can also catch the virus by touching something that was recently contaminated with the virus and then touching your mouth, nose, or eyes. RISKS AND COMPLICATIONS You may be at risk for a more severe case of influenza if you smoke cigarettes, have diabetes, have chronic heart disease (such as heart failure) or lung disease (such as asthma), or if you have a weakened immune system. Elderly people and pregnant women are also at risk for more serious infections. The most common problem of influenza is a lung infection (pneumonia). Sometimes, this problem can require emergency medical care and may be life threatening. SIGNS AND SYMPTOMS  Symptoms typically last 4 to 10 days and may include:  Fever.  Chills.  Headache, body aches, and muscle aches.  Sore throat.  Chest discomfort and cough.  Poor appetite.  Weakness or feeling tired.  Dizziness.  Nausea or vomiting. DIAGNOSIS  Diagnosis of influenza is often made based on your history and a physical exam. A nose or throat swab test can be done to confirm the diagnosis. TREATMENT  In mild cases, influenza goes away on its own. Treatment is directed at relieving symptoms. For more severe cases, your health care provider may prescribe antiviral medicines  to shorten the sickness. Antibiotic medicines are not effective because the infection is caused by a virus, not by bacteria. HOME CARE INSTRUCTIONS  Take medicines only as directed by your health care provider.  Use a cool mist humidifier to make breathing easier.  Get plenty of rest until your temperature returns to normal. This usually takes 3 to 4 days.  Drink enough fluid to keep your urine clear or pale yellow.  Cover yourmouth and nosewhen coughing or sneezing,and wash your handswellto prevent thevirusfrom spreading.  Stay homefromwork orschool untilthe fever is gonefor at least 59full day. PREVENTION  An annual influenza vaccination (flu shot) is the best way to avoid getting influenza. An annual flu shot is now routinely recommended for all adults in the [place] IF:  You experiencechest pain, yourcough worsens,or you producemore mucus.  Youhave nausea,vomiting, ordiarrhea.  Your fever returns or gets worse. SEEK IMMEDIATE MEDICAL CARE IF:  You havetrouble breathing, you become short of breath,or your skin ornails becomebluish.  You have severe painor stiffnessin the neck.  You develop a sudden headache, or pain in the face or ear.  You have nausea or vomiting that you cannot control. MAKE SURE YOU:   Understand these instructions.  Will watch your condition.  Will get help right away if you are not doing well or get worse. Document Released: 12/10/2000 Document Revised: 04/29/2014 Document Reviewed: 03/13/2012 [facility] Patient Information 2015 [place], [place]. This information is not intended to replace advice given to you by your  health care provider. Make sure you discuss any questions you have with your health care provider.

## 2015-04-11 ENCOUNTER — Ambulatory Visit
Admission: RE | Admit: 2015-04-11 | Discharge: 2015-04-11 | Disposition: A | Discharge status: Home / Self Care | Payer: PRIVATE HEALTH INSURANCE | Source: Ambulatory Visit

## 2015-04-11 DIAGNOSIS — Z1231 Encounter for screening mammogram for malignant neoplasm of breast: Secondary | ICD-10-CM

## 2015-10-28 LAB — HM PAP SMEAR: HM PAP: NEGATIVE

## 2015-11-12 LAB — HM PAP SMEAR: HM Pap smear: NEGATIVE

## 2015-11-28 ENCOUNTER — Other Ambulatory Visit (INDEPENDENT_AMBULATORY_CARE_PROVIDER_SITE_OTHER): Payer: 59

## 2015-11-28 DIAGNOSIS — Z0000 Encounter for general adult medical examination without abnormal findings: Secondary | ICD-10-CM | POA: Diagnosis not present

## 2015-11-28 LAB — CBC WITH DIFFERENTIAL/PLATELET
Basophils Absolute: 0 10*3/uL (ref 0.0–0.1)
Basophils Relative: 0.5 % (ref 0.0–3.0)
EOS ABS: 0.2 10*3/uL (ref 0.0–0.7)
Eosinophils Relative: 3.7 % (ref 0.0–5.0)
HCT: 41.8 % (ref 36.0–46.0)
Hemoglobin: 13.6 g/dL (ref 12.0–15.0)
LYMPHS ABS: 1.9 10*3/uL (ref 0.7–4.0)
Lymphocytes Relative: 37.2 % (ref 12.0–46.0)
MCHC: 32.5 g/dL (ref 30.0–36.0)
MCV: 87.7 fl (ref 78.0–100.0)
MONO ABS: 0.5 10*3/uL (ref 0.1–1.0)
Monocytes Relative: 10.8 % (ref 3.0–12.0)
Neutro Abs: 2.4 10*3/uL (ref 1.4–7.7)
Neutrophils Relative %: 47.8 % (ref 43.0–77.0)
Platelets: 238 10*3/uL (ref 150.0–400.0)
RBC: 4.77 Mil/uL (ref 3.87–5.11)
RDW: 14.2 % (ref 11.5–15.5)
WBC: 5 10*3/uL (ref 4.0–10.5)

## 2015-11-28 LAB — BASIC METABOLIC PANEL
BUN: 11 mg/dL (ref 6–23)
CO2: 29 mEq/L (ref 19–32)
Calcium: 9.6 mg/dL (ref 8.4–10.5)
Chloride: 104 mEq/L (ref 96–112)
Creatinine, Ser: 0.74 mg/dL (ref 0.40–1.20)
GFR: 89.96 mL/min (ref >60.00)
GLUCOSE: 86 mg/dL (ref 70–99)
Potassium: 5.3 mEq/L — ABNORMAL HIGH (ref 3.5–5.1)
Sodium: 142 mEq/L (ref 135–145)

## 2015-11-28 LAB — LIPID PANEL
CHOLESTEROL: 154 mg/dL (ref 0–200)
HDL: 71 mg/dL (ref >39.00)
LDL Cholesterol: 70 mg/dL (ref 0–99)
NonHDL: 82.92
Total CHOL/HDL Ratio: 2
Triglycerides: 63 mg/dL (ref 0.0–149.0)
VLDL: 12.6 mg/dL (ref 0.0–40.0)

## 2015-11-28 LAB — HEPATIC FUNCTION PANEL
ALBUMIN: 4.2 g/dL (ref 3.5–5.2)
ALT: 20 U/L (ref 0–35)
AST: 12 U/L (ref 0–37)
Alkaline Phosphatase: 70 U/L (ref 39–117)
Bilirubin, Direct: 0.1 mg/dL (ref 0.0–0.3)
TOTAL PROTEIN: 7 g/dL (ref 6.0–8.3)
Total Bilirubin: 0.5 mg/dL (ref 0.2–1.2)

## 2015-11-28 LAB — TSH: TSH: 2.71 u[IU]/mL (ref 0.35–4.50)

## 2015-12-03 ENCOUNTER — Encounter: Payer: PRIVATE HEALTH INSURANCE | Admitting: Family Medicine

## 2015-12-04 ENCOUNTER — Encounter: Payer: Self-pay | Admitting: Family Medicine

## 2015-12-04 ENCOUNTER — Ambulatory Visit (INDEPENDENT_AMBULATORY_CARE_PROVIDER_SITE_OTHER): Payer: 59 | Admitting: Family Medicine

## 2015-12-04 VITALS — BP 84/58 | HR 89 | Temp 98.1°F | Resp 16 | Ht 65.0 in | Wt 138.9 lb

## 2015-12-04 DIAGNOSIS — Z0000 Encounter for general adult medical examination without abnormal findings: Secondary | ICD-10-CM | POA: Diagnosis not present

## 2015-12-04 NOTE — Progress Notes (Signed)
   Subjective:    Patient ID: Danielle Weeks, female    DOB: 1971/07/24, 44 y.o.   MRN: [id number]  HPI Patient here for complete physical. She sees gynecologist regularly and had Pap smear in November which was normal. She gets yearly mammograms. Nonsmoker. Walks for exercise. Screened by dermatologist yearly. No chronic medical problems. No flu vaccine yet and she declines today. Tetanus up-to-date.  Reviewed with no changes:  Past Medical History  Diagnosis Date  . Chicken pox   . UTI (lower urinary tract infection)   . TMJ (dislocation of temporomandibular joint)   . ASCUS (atypical squamous cells of undetermined significance) on Pap smear     biopsied twice   Past Surgical History  Procedure Laterality Date  . Laparoscopic appendectomy N/A 06/29/2013    Procedure: APPENDECTOMY LAPAROSCOPIC;  Surgeon: Zenovia Jarred, MD;  Location: [facility];  Service: General;  Laterality: N/A;    reports that she has never smoked. She has never used smokeless tobacco. She reports that she drinks alcohol. She reports that she does not use illicit drugs. family history includes Alcohol abuse in her maternal grandfather; Birth defects in her maternal grandmother; Cancer in her father and maternal grandmother; Leukemia in her paternal grandfather. Allergies  Allergen Reactions  . Penicillins     GI upset      Review of Systems  Constitutional: Negative for fever, activity change, appetite change, fatigue and unexpected weight change.  HENT: Negative for ear pain, hearing loss, sore throat and trouble swallowing.   Eyes: Negative for visual disturbance.  Respiratory: Negative for cough and shortness of breath.   Cardiovascular: Negative for chest pain and palpitations.  Gastrointestinal: Negative for abdominal pain, diarrhea, constipation and blood in stool.  Genitourinary: Negative for dysuria and hematuria.  Musculoskeletal: Negative for myalgias, back pain and arthralgias.  Skin: Negative for  rash.  Neurological: Negative for dizziness, syncope and headaches.  Hematological: Negative for adenopathy.  Psychiatric/Behavioral: Negative for confusion and dysphoric mood.       Objective:   Physical Exam  Constitutional: She is oriented to person, place, and time. She appears well-developed and well-nourished.  HENT:  Head: Normocephalic and atraumatic.  Eyes: EOM are normal. Pupils are equal, round, and reactive to light.  Neck: Normal range of motion. Neck supple. No thyromegaly present.  Cardiovascular: Normal rate, regular rhythm and normal heart sounds.   No murmur heard. Pulmonary/Chest: Breath sounds normal. No respiratory distress. She has no wheezes. She has no rales.  Abdominal: Soft. Bowel sounds are normal. She exhibits no distension and no mass. There is no tenderness. There is no rebound and no guarding.  Musculoskeletal: Normal range of motion. She exhibits no edema.  Lymphadenopathy:    She has no cervical adenopathy.  Neurological: She is alert and oriented to person, place, and time. She displays normal reflexes. No cranial nerve deficit.  Skin: No rash noted.  Psychiatric: She has a normal mood and affect. Her behavior is normal. Judgment and thought content normal.          Assessment & Plan:  Physical exam. Labs reviewed with no major concerns. Flu vaccine offered and declined. She plans to get this later. She has a cold currently. Continue GYN follow-up. We discussed regular calcium and vitamin D and weightbearing exercise

## 2015-12-04 NOTE — Progress Notes (Signed)
Pre visit review using our clinic review tool, if applicable. No additional management support is needed unless otherwise documented below in the visit note. 

## 2015-12-18 ENCOUNTER — Encounter: Payer: Self-pay | Admitting: Family Medicine

## 2016-03-16 ENCOUNTER — Other Ambulatory Visit: Payer: Self-pay

## 2016-03-16 DIAGNOSIS — Z1231 Encounter for screening mammogram for malignant neoplasm of breast: Secondary | ICD-10-CM

## 2016-04-22 ENCOUNTER — Ambulatory Visit
Admission: RE | Admit: 2016-04-22 | Discharge: 2016-04-22 | Disposition: A | Discharge status: Home / Self Care | Payer: 59 | Source: Ambulatory Visit

## 2016-04-22 DIAGNOSIS — Z1231 Encounter for screening mammogram for malignant neoplasm of breast: Secondary | ICD-10-CM

## 2016-12-01 ENCOUNTER — Other Ambulatory Visit: Payer: 59

## 2016-12-01 ENCOUNTER — Other Ambulatory Visit (INDEPENDENT_AMBULATORY_CARE_PROVIDER_SITE_OTHER): Payer: 59

## 2016-12-01 DIAGNOSIS — Z0000 Encounter for general adult medical examination without abnormal findings: Secondary | ICD-10-CM | POA: Diagnosis not present

## 2016-12-01 LAB — CBC WITH DIFFERENTIAL/PLATELET
BASOS ABS: 0 10*3/uL (ref 0.0–0.1)
Basophils Relative: 0.8 % (ref 0.0–3.0)
EOS ABS: 0.2 10*3/uL (ref 0.0–0.7)
Eosinophils Relative: 3.1 % (ref 0.0–5.0)
HCT: 37.8 % (ref 36.0–46.0)
Hemoglobin: 12.5 g/dL (ref 12.0–15.0)
LYMPHS ABS: 2.2 10*3/uL (ref 0.7–4.0)
Lymphocytes Relative: 37.6 % (ref 12.0–46.0)
MCHC: 33.1 g/dL (ref 30.0–36.0)
MCV: 86.9 fl (ref 78.0–100.0)
Monocytes Absolute: 0.4 10*3/uL (ref 0.1–1.0)
Monocytes Relative: 7.5 % (ref 3.0–12.0)
NEUTROS ABS: 3 10*3/uL (ref 1.4–7.7)
NEUTROS PCT: 51 % (ref 43.0–77.0)
PLATELETS: 288 10*3/uL (ref 150.0–400.0)
RBC: 4.35 Mil/uL (ref 3.87–5.11)
RDW: 13.9 % (ref 11.5–15.5)
WBC: 6 10*3/uL (ref 4.0–10.5)

## 2016-12-01 LAB — HEPATIC FUNCTION PANEL
ALBUMIN: 4.1 g/dL (ref 3.5–5.2)
ALK PHOS: 57 U/L (ref 39–117)
ALT: 12 U/L (ref 0–35)
AST: 8 U/L (ref 0–37)
BILIRUBIN DIRECT: 0.1 mg/dL (ref 0.0–0.3)
Total Bilirubin: 0.5 mg/dL (ref 0.2–1.2)
Total Protein: 6.3 g/dL (ref 6.0–8.3)

## 2016-12-01 LAB — BASIC METABOLIC PANEL
BUN: 9 mg/dL (ref 6–23)
CALCIUM: 9.1 mg/dL (ref 8.4–10.5)
CO2: 32 meq/L (ref 19–32)
CREATININE: 0.69 mg/dL (ref 0.40–1.20)
Chloride: 104 mEq/L (ref 96–112)
GFR: 97.1 mL/min (ref >60.00)
GLUCOSE: 91 mg/dL (ref 70–99)
Potassium: 4.5 mEq/L (ref 3.5–5.1)
Sodium: 139 mEq/L (ref 135–145)

## 2016-12-01 LAB — LIPASE: LIPASE: 14 U/L (ref 11.0–59.0)

## 2016-12-01 LAB — LIPID PANEL
CHOL/HDL RATIO: 2
Cholesterol: 139 mg/dL (ref 0–200)
HDL: 68.7 mg/dL (ref >39.00)
LDL Cholesterol: 58 mg/dL (ref 0–99)
NONHDL: 70.35
Triglycerides: 63 mg/dL (ref 0.0–149.0)
VLDL: 12.6 mg/dL (ref 0.0–40.0)

## 2016-12-01 LAB — TSH: TSH: 2.18 u[IU]/mL (ref 0.35–4.50)

## 2016-12-03 LAB — VITAMIN D 1,25 DIHYDROXY
VITAMIN D 1, 25 (OH) TOTAL: 58 pg/mL (ref 18–72)
Vitamin D3 1, 25 (OH)2: 58 pg/mL

## 2016-12-07 ENCOUNTER — Ambulatory Visit (INDEPENDENT_AMBULATORY_CARE_PROVIDER_SITE_OTHER): Payer: 59 | Admitting: Family Medicine

## 2016-12-07 ENCOUNTER — Encounter: Payer: Self-pay | Admitting: Family Medicine

## 2016-12-07 VITALS — BP 92/70 | HR 89 | Temp 97.3°F | Ht 65.0 in | Wt 143.6 lb

## 2016-12-07 DIAGNOSIS — Z0000 Encounter for general adult medical examination without abnormal findings: Secondary | ICD-10-CM

## 2016-12-07 DIAGNOSIS — Z23 Encounter for immunization: Secondary | ICD-10-CM

## 2016-12-07 NOTE — Progress Notes (Signed)
Subjective:     Patient ID: Danielle Weeks, female   DOB: 1971/03/16, 45 y.o.   MRN: [id number]  HPI Patient seen for complete physical. She is generally very healthy. She's has concerns because her sister recently had acute pancreatitis with no clear etiology. She was concerned about whether they may be some genetic predisposition. Patient is followed by dermatologist and gynecologist yearly. No consistent exercise. Nonsmoker. No regular alcohol use. Generally feels well. Needs flu vaccine. Tetanus up-to-date.  Past Medical History:  Diagnosis Date  . ASCUS (atypical squamous cells of undetermined significance) on Pap smear    biopsied twice  . Chicken pox   . TMJ (dislocation of temporomandibular joint)   . UTI (lower urinary tract infection)    Past Surgical History:  Procedure Laterality Date  . LAPAROSCOPIC APPENDECTOMY N/A 06/29/2013   Procedure: APPENDECTOMY LAPAROSCOPIC;  Surgeon: Zenovia Jarred, MD;  Location: [facility];  Service: General;  Laterality: N/A;    reports that she has never smoked. She has never used smokeless tobacco. She reports that she drinks alcohol. She reports that she does not use drugs. family history includes Alcohol abuse in her maternal grandfather; Birth defects in her maternal grandmother; Cancer in her father and maternal grandmother; Leukemia in her paternal grandfather; Pancreatitis in her sister. Allergies  Allergen Reactions  . Penicillins     GI upset     Review of Systems  Constitutional: Negative for activity change, appetite change, fatigue, fever and unexpected weight change.  HENT: Negative for ear pain, hearing loss, sore throat and trouble swallowing.   Eyes: Negative for visual disturbance.  Respiratory: Negative for cough and shortness of breath.   Cardiovascular: Negative for chest pain and palpitations.  Gastrointestinal: Negative for abdominal pain, blood in stool, constipation and diarrhea.  Genitourinary: Negative for dysuria and  hematuria.  Musculoskeletal: Negative for arthralgias, back pain and myalgias.  Skin: Negative for rash.  Neurological: Negative for dizziness, syncope and headaches.  Hematological: Negative for adenopathy.  Psychiatric/Behavioral: Negative for confusion and dysphoric mood.       Objective:   Physical Exam  Constitutional: She is oriented to person, place, and time. She appears well-developed and well-nourished.  HENT:  Head: Normocephalic and atraumatic.  Eyes: EOM are normal. Pupils are equal, round, and reactive to light.  Neck: Normal range of motion. Neck supple. No thyromegaly present.  Cardiovascular: Normal rate, regular rhythm and normal heart sounds.   No murmur heard. Pulmonary/Chest: Breath sounds normal. No respiratory distress. She has no wheezes. She has no rales.  Abdominal: Soft. Bowel sounds are normal. She exhibits no distension and no mass. There is no tenderness. There is no rebound and no guarding.  Musculoskeletal: Normal range of motion. She exhibits no edema.  Lymphadenopathy:    She has no cervical adenopathy.  Neurological: She is alert and oriented to person, place, and time. She displays normal reflexes. No cranial nerve deficit.  Skin: No rash noted.  Psychiatric: She has a normal mood and affect. Her behavior is normal. Judgment and thought content normal.       Assessment:     Physical exam. Generally healthy 45 year old female    Plan:     -Labs reviewed with no major concerns. She has excellent lipids. -Flu vaccine given. -We recommended goal of minimum of 150 minutes of moderate exercise per week -she plans continue with gyn follow up.  Eulas Post MD [facility]

## 2016-12-07 NOTE — Progress Notes (Signed)
Pre visit review using our clinic review tool, if applicable. No additional management support is needed unless otherwise documented below in the visit note. 

## 2017-03-08 ENCOUNTER — Other Ambulatory Visit: Payer: Self-pay | Admitting: Obstetrics and Gynecology

## 2017-03-08 DIAGNOSIS — Z1231 Encounter for screening mammogram for malignant neoplasm of breast: Secondary | ICD-10-CM

## 2017-04-28 ENCOUNTER — Ambulatory Visit
Admission: RE | Admit: 2017-04-28 | Discharge: 2017-04-28 | Disposition: A | Discharge status: Home / Self Care | Payer: 59 | Source: Ambulatory Visit | Attending: Obstetrics and Gynecology | Admitting: Obstetrics and Gynecology

## 2017-04-28 DIAGNOSIS — Z1231 Encounter for screening mammogram for malignant neoplasm of breast: Secondary | ICD-10-CM

## 2017-09-15 ENCOUNTER — Encounter: Payer: Self-pay | Admitting: Family Medicine

## 2017-11-15 DIAGNOSIS — L720 Epidermal cyst: Secondary | ICD-10-CM | POA: Diagnosis not present

## 2017-11-15 DIAGNOSIS — L814 Other melanin hyperpigmentation: Secondary | ICD-10-CM | POA: Diagnosis not present

## 2017-11-15 DIAGNOSIS — L819 Disorder of pigmentation, unspecified: Secondary | ICD-10-CM | POA: Diagnosis not present

## 2017-11-15 DIAGNOSIS — L821 Other seborrheic keratosis: Secondary | ICD-10-CM | POA: Diagnosis not present

## 2018-02-06 DIAGNOSIS — F4320 Adjustment disorder, unspecified: Secondary | ICD-10-CM | POA: Diagnosis not present

## 2018-02-23 DIAGNOSIS — F4320 Adjustment disorder, unspecified: Secondary | ICD-10-CM | POA: Diagnosis not present

## 2018-02-28 ENCOUNTER — Ambulatory Visit: Payer: BLUE CROSS/BLUE SHIELD | Admitting: Psychology

## 2018-02-28 DIAGNOSIS — F4323 Adjustment disorder with mixed anxiety and depressed mood: Secondary | ICD-10-CM | POA: Diagnosis not present

## 2018-03-07 DIAGNOSIS — M4180 Other forms of scoliosis, site unspecified: Secondary | ICD-10-CM | POA: Diagnosis not present

## 2018-03-07 DIAGNOSIS — M542 Cervicalgia: Secondary | ICD-10-CM | POA: Diagnosis not present

## 2018-03-07 DIAGNOSIS — M75100 Unspecified rotator cuff tear or rupture of unspecified shoulder, not specified as traumatic: Secondary | ICD-10-CM | POA: Diagnosis not present

## 2018-03-14 DIAGNOSIS — M542 Cervicalgia: Secondary | ICD-10-CM | POA: Diagnosis not present

## 2018-03-14 DIAGNOSIS — M75100 Unspecified rotator cuff tear or rupture of unspecified shoulder, not specified as traumatic: Secondary | ICD-10-CM | POA: Diagnosis not present

## 2018-03-14 DIAGNOSIS — M4180 Other forms of scoliosis, site unspecified: Secondary | ICD-10-CM | POA: Diagnosis not present

## 2018-03-21 DIAGNOSIS — M75100 Unspecified rotator cuff tear or rupture of unspecified shoulder, not specified as traumatic: Secondary | ICD-10-CM | POA: Diagnosis not present

## 2018-03-21 DIAGNOSIS — M4180 Other forms of scoliosis, site unspecified: Secondary | ICD-10-CM | POA: Diagnosis not present

## 2018-03-21 DIAGNOSIS — M542 Cervicalgia: Secondary | ICD-10-CM | POA: Diagnosis not present

## 2018-03-28 ENCOUNTER — Ambulatory Visit: Payer: BLUE CROSS/BLUE SHIELD | Admitting: Psychology

## 2018-03-28 DIAGNOSIS — F4323 Adjustment disorder with mixed anxiety and depressed mood: Secondary | ICD-10-CM

## 2018-04-06 ENCOUNTER — Other Ambulatory Visit: Payer: Self-pay | Admitting: Obstetrics and Gynecology

## 2018-04-06 DIAGNOSIS — Z1231 Encounter for screening mammogram for malignant neoplasm of breast: Secondary | ICD-10-CM

## 2018-04-07 DIAGNOSIS — H524 Presbyopia: Secondary | ICD-10-CM | POA: Diagnosis not present

## 2018-04-07 DIAGNOSIS — H5213 Myopia, bilateral: Secondary | ICD-10-CM | POA: Diagnosis not present

## 2018-04-11 ENCOUNTER — Ambulatory Visit: Payer: BLUE CROSS/BLUE SHIELD | Admitting: Psychology

## 2018-04-19 ENCOUNTER — Encounter: Payer: Self-pay | Admitting: Family Medicine

## 2018-04-19 ENCOUNTER — Ambulatory Visit (INDEPENDENT_AMBULATORY_CARE_PROVIDER_SITE_OTHER): Payer: BLUE CROSS/BLUE SHIELD | Admitting: Family Medicine

## 2018-04-19 VITALS — BP 98/62 | HR 64 | Temp 98.0°F | Ht 65.25 in | Wt 145.4 lb

## 2018-04-19 DIAGNOSIS — Z0000 Encounter for general adult medical examination without abnormal findings: Secondary | ICD-10-CM

## 2018-04-19 DIAGNOSIS — R0789 Other chest pain: Secondary | ICD-10-CM

## 2018-04-19 DIAGNOSIS — R002 Palpitations: Secondary | ICD-10-CM

## 2018-04-19 LAB — LIPID PANEL
Cholesterol: 150 mg/dL (ref 0–200)
HDL: 71.3 mg/dL (ref >39.00)
LDL Cholesterol: 64 mg/dL (ref 0–99)
NONHDL: 78.25
TRIGLYCERIDES: 73 mg/dL (ref 0.0–149.0)
Total CHOL/HDL Ratio: 2
VLDL: 14.6 mg/dL (ref 0.0–40.0)

## 2018-04-19 LAB — HEPATIC FUNCTION PANEL
ALK PHOS: 70 U/L (ref 39–117)
ALT: 15 U/L (ref 0–35)
AST: 10 U/L (ref 0–37)
Albumin: 4.1 g/dL (ref 3.5–5.2)
Bilirubin, Direct: 0.1 mg/dL (ref 0.0–0.3)
TOTAL PROTEIN: 6.7 g/dL (ref 6.0–8.3)
Total Bilirubin: 0.6 mg/dL (ref 0.2–1.2)

## 2018-04-19 LAB — CBC WITH DIFFERENTIAL/PLATELET
BASOS PCT: 0.5 % (ref 0.0–3.0)
Basophils Absolute: 0 10*3/uL (ref 0.0–0.1)
EOS PCT: 3.6 % (ref 0.0–5.0)
Eosinophils Absolute: 0.2 10*3/uL (ref 0.0–0.7)
HCT: 39.9 % (ref 36.0–46.0)
Hemoglobin: 13.3 g/dL (ref 12.0–15.0)
LYMPHS ABS: 2.6 10*3/uL (ref 0.7–4.0)
Lymphocytes Relative: 45.8 % (ref 12.0–46.0)
MCHC: 33.2 g/dL (ref 30.0–36.0)
MCV: 86 fl (ref 78.0–100.0)
MONO ABS: 0.5 10*3/uL (ref 0.1–1.0)
MONOS PCT: 8.1 % (ref 3.0–12.0)
NEUTROS ABS: 2.4 10*3/uL (ref 1.4–7.7)
NEUTROS PCT: 42 % — AB (ref 43.0–77.0)
PLATELETS: 241 10*3/uL (ref 150.0–400.0)
RBC: 4.64 Mil/uL (ref 3.87–5.11)
RDW: 14.2 % (ref 11.5–15.5)
WBC: 5.6 10*3/uL (ref 4.0–10.5)

## 2018-04-19 LAB — BASIC METABOLIC PANEL
BUN: 9 mg/dL (ref 6–23)
CALCIUM: 9.2 mg/dL (ref 8.4–10.5)
CHLORIDE: 101 meq/L (ref 96–112)
CO2: 32 meq/L (ref 19–32)
CREATININE: 0.67 mg/dL (ref 0.40–1.20)
GFR: 99.85 mL/min (ref >60.00)
GLUCOSE: 82 mg/dL (ref 70–99)
Potassium: 4.6 mEq/L (ref 3.5–5.1)
Sodium: 138 mEq/L (ref 135–145)

## 2018-04-19 LAB — TSH: TSH: 1.95 u[IU]/mL (ref 0.35–4.50)

## 2018-04-19 NOTE — Progress Notes (Signed)
Subjective:     Patient ID: Danielle Weeks, female   DOB: December 15, 1971, 47 y.o.   MRN: [id number]  HPI Patient is here for physical exam. Generally very healthy. Takes no regular medications. She has had some episodes over the past year usually where she was sitting down at night propped up in a position with a pillow when she noticed some transient tightness chest mostly right sided usually last a few seconds. She's never had exertional symptoms. Walks for exercise with no symptoms.  No radiation into the arm or neck. No GERD symptoms. No associated nausea.  Very low risk for CAD. Nonsmoker. No history of diabetes or hypertension. No family history of premature CAD. Lipids have been favorable  She sees gynecologist yearly. Gets regular Pap smears and is getting mammograms through their office.  Past Medical History:  Diagnosis Date  . ASCUS (atypical squamous cells of undetermined significance) on Pap smear    biopsied twice  . Chicken pox   . TMJ (dislocation of temporomandibular joint)   . UTI (lower urinary tract infection)    Past Surgical History:  Procedure Laterality Date  . LAPAROSCOPIC APPENDECTOMY N/A 06/29/2013   Procedure: APPENDECTOMY LAPAROSCOPIC;  Surgeon: Zenovia Jarred, MD;  Location: [facility] [facility];  Service: General;  Laterality: N/A;    reports that she has never smoked. She has never used smokeless tobacco. She reports that she drinks alcohol. She reports that she does not use drugs. family history includes Alcohol abuse in her maternal grandfather; Birth defects in her maternal grandmother; Cancer in her father and maternal grandmother; Leukemia in her paternal grandfather; Pancreatitis in her sister. Allergies  Allergen Reactions  . Penicillins     GI upset     Review of Systems  Constitutional: Negative for activity change, appetite change, fatigue, fever and unexpected weight change.  HENT: Negative for ear pain, hearing loss, sore throat and trouble swallowing.    Eyes: Negative for visual disturbance.  Respiratory: Negative for cough, shortness of breath and wheezing.   Cardiovascular: Positive for chest pain. Negative for palpitations.  Gastrointestinal: Negative for abdominal pain, blood in stool, constipation and diarrhea.  Genitourinary: Negative for dysuria and hematuria.  Musculoskeletal: Negative for arthralgias, back pain and myalgias.  Skin: Negative for rash.  Neurological: Negative for dizziness, syncope and headaches.  Hematological: Negative for adenopathy.  Psychiatric/Behavioral: Negative for confusion and dysphoric mood.       Objective:   Physical Exam  Constitutional: She is oriented to person, place, and time. She appears well-developed and well-nourished.  HENT:  Right Ear: External ear normal.  Left Ear: External ear normal.  Mouth/Throat: Oropharynx is clear and moist.  Neck: Neck supple. No thyromegaly present.  Cardiovascular: Normal rate and regular rhythm. Exam reveals no gallop and no friction rub.  No murmur heard. Pulmonary/Chest: Effort normal and breath sounds normal. No respiratory distress. She has no wheezes. She has no rales.  Abdominal: Soft. Bowel sounds are normal. She exhibits no distension. There is no tenderness. There is no rebound and no guarding.  Musculoskeletal: She exhibits no edema.  Lymphadenopathy:    She has no cervical adenopathy.  Neurological: She is alert and oriented to person, place, and time.       Assessment:     #1 physical exam. We discussed the following preventative items as below  #2 atypical chest pain. Atypical symptoms include symptoms at rest, brief duration of only seconds, right sided symptoms. Patient is also extremely low risk  Plan:     -EKG shows sinus rhythm with no acute changes. Heart rate around 58 -Patient's 10 year risk for CAD event is 0.2% which is extremely low -We discussed potential other workup for chest pain symptoms including stress echo  versus nuclear stress test if she has any progressive symptoms with this point no further evaluation recommended -Obtain screening lab work  Eulas Post MD [facility]

## 2018-04-25 ENCOUNTER — Ambulatory Visit: Payer: BLUE CROSS/BLUE SHIELD | Admitting: Psychology

## 2018-04-25 DIAGNOSIS — F4323 Adjustment disorder with mixed anxiety and depressed mood: Secondary | ICD-10-CM

## 2018-05-05 ENCOUNTER — Ambulatory Visit
Admission: RE | Admit: 2018-05-05 | Discharge: 2018-05-05 | Disposition: A | Discharge status: Home / Self Care | Payer: BLUE CROSS/BLUE SHIELD | Source: Ambulatory Visit | Attending: Obstetrics and Gynecology | Admitting: Obstetrics and Gynecology

## 2018-05-05 DIAGNOSIS — Z1231 Encounter for screening mammogram for malignant neoplasm of breast: Secondary | ICD-10-CM

## 2018-05-08 ENCOUNTER — Other Ambulatory Visit: Payer: Self-pay | Admitting: Obstetrics and Gynecology

## 2018-05-08 DIAGNOSIS — R928 Other abnormal and inconclusive findings on diagnostic imaging of breast: Secondary | ICD-10-CM

## 2018-05-09 ENCOUNTER — Ambulatory Visit: Payer: BLUE CROSS/BLUE SHIELD | Admitting: Psychology

## 2018-05-10 ENCOUNTER — Ambulatory Visit: Payer: Self-pay

## 2018-05-10 ENCOUNTER — Ambulatory Visit
Admission: RE | Admit: 2018-05-10 | Discharge: 2018-05-10 | Disposition: A | Discharge status: Home / Self Care | Payer: BLUE CROSS/BLUE SHIELD | Source: Ambulatory Visit | Attending: Obstetrics and Gynecology | Admitting: Obstetrics and Gynecology

## 2018-05-10 DIAGNOSIS — R928 Other abnormal and inconclusive findings on diagnostic imaging of breast: Secondary | ICD-10-CM

## 2018-05-10 DIAGNOSIS — R922 Inconclusive mammogram: Secondary | ICD-10-CM | POA: Diagnosis not present

## 2018-05-17 ENCOUNTER — Ambulatory Visit: Payer: BLUE CROSS/BLUE SHIELD | Admitting: Psychology

## 2018-06-08 ENCOUNTER — Ambulatory Visit: Payer: BLUE CROSS/BLUE SHIELD | Admitting: Psychology

## 2018-06-08 DIAGNOSIS — F4323 Adjustment disorder with mixed anxiety and depressed mood: Secondary | ICD-10-CM | POA: Diagnosis not present

## 2018-06-12 ENCOUNTER — Ambulatory Visit: Payer: BLUE CROSS/BLUE SHIELD | Admitting: Cardiology

## 2018-06-12 ENCOUNTER — Encounter: Payer: Self-pay | Admitting: Cardiology

## 2018-06-12 VITALS — BP 92/54 | HR 68 | Ht 65.0 in | Wt 147.0 lb

## 2018-06-12 DIAGNOSIS — R072 Precordial pain: Secondary | ICD-10-CM

## 2018-06-12 NOTE — Progress Notes (Signed)
Referring-Bruce Burchette, MD Reason for referral- chest pain  HPI: 47 year old female for evaluation of chest pain at request of Carolann Littler, MD. patient states that since February she has had intermittent chest pain.  It is in the right chest described as a tightness/pressure.  It does not radiate.  It is not pleuritic, positional or exertional.  Resolves spontaneously and several seconds.  No associated symptoms.  She walks routinely and denies dyspnea on exertion, orthopnea, PND, pedal edema, exertional chest pain or syncope.  Occasional brief palpitations.  Because of the above we were asked to evaluate.  Current Outpatient Medications  Medication Sig Dispense Refill  . cholecalciferol (VITAMIN D) 1000 units tablet Take 5,000 Units by mouth daily.    . nitrofurantoin, macrocrystal-monohydrate, (MACROBID) 100 MG capsule Take 100 mg by mouth.     No current facility-administered medications for this visit.     Allergies  Allergen Reactions  . Penicillins     GI upset     Past Medical History:  Diagnosis Date  . ASCUS (atypical squamous cells of undetermined significance) on Pap smear    biopsied twice  . Chicken pox   . TMJ (dislocation of temporomandibular joint)   . UTI (lower urinary tract infection)     Past Surgical History:  Procedure Laterality Date  . LAPAROSCOPIC APPENDECTOMY N/A 06/29/2013   Procedure: APPENDECTOMY LAPAROSCOPIC;  Surgeon: Zenovia Jarred, MD;  Location: [facility] OR;  Service: General;  Laterality: N/A;    Social History   Socioeconomic History  . Marital status: Married    Spouse name: Not on file  . Number of children: 2  . Years of education: Not on file  . Highest education level: Not on file  Occupational History  . Not on file  Social Needs  . Financial resource strain: Not on file  . Food insecurity:    Worry: Not on file    Inability: Not on file  . Transportation needs:    Medical: Not on file    Non-medical: Not on file    Tobacco Use  . Smoking status: Never Smoker  . Smokeless tobacco: Never Used  Substance and Sexual Activity  . Alcohol use: Yes    Comment: ocasional  . Drug use: No  . Sexual activity: Not on file  Lifestyle  . Physical activity:    Days per week: Not on file    Minutes per session: Not on file  . Stress: Not on file  Relationships  . Social connections:    Talks on phone: Not on file    Gets together: Not on file    Attends religious service: Not on file    Active member of club or organization: Not on file    Attends meetings of clubs or organizations: Not on file    Relationship status: Not on file  . Intimate partner violence:    Fear of current or ex partner: Not on file    Emotionally abused: Not on file    Physically abused: Not on file    Forced sexual activity: Not on file  Other Topics Concern  . Not on file  Social History Narrative  . Not on file    Family History  Problem Relation Age of Onset  . Cancer Father        acoustic neuroma and melanoma  . Cancer Maternal Grandmother   . Birth defects Maternal Grandmother        colon  . Alcohol abuse  Maternal Grandfather   . Leukemia Paternal Grandfather   . Pancreatitis Sister   . Breast cancer Neg Hx     ROS: no fevers or chills, productive cough, hemoptysis, dysphasia, odynophagia, melena, hematochezia, dysuria, hematuria, rash, seizure activity, orthopnea, PND, pedal edema, claudication. Remaining systems are negative.  Physical Exam:   Blood pressure (!) 92/54, pulse 68, height 5\' 5"  (1.651 m), weight 147 lb (66.7 kg).  General:  Well developed/well nourished in NAD Skin warm/dry Patient not depressed No peripheral clubbing Back-normal HEENT-normal/normal eyelids Neck supple/normal carotid upstroke bilaterally; no bruits; no JVD; no thyromegaly chest - CTA/ normal expansion CV - RRR/normal S1 and S2; no murmurs, rubs or gallops;  PMI nondisplaced Abdomen -NT/ND, no HSM, no mass, + bowel  sounds, no bruit 2+ femoral pulses, no bruits Ext-no edema, chords, 2+ DP Neuro-grossly nonfocal  ECG -April 19, 2018-sinus bradycardia, short PR interval, no ST changes.  Personally reviewed  A/P  1 chest pain-etiology unclear.  Question musculoskeletal.  Her electrocardiogram shows no ST changes.  I will arrange an echocardiogram to assess LV function and wall motion.  If normal we will follow.  We can consider further evaluation if symptoms worsen.  Kirk Ruths, MD

## 2018-06-12 NOTE — Patient Instructions (Signed)
Medication Instructions:   NO CHANGE  Testing/Procedures:  Your physician has requested that you have an echocardiogram. Echocardiography is a painless test that uses sound waves to create images of your heart. It provides your doctor with information about the size and shape of your heart and how well your heart's chambers and valves are working. This procedure takes approximately one hour. There are no restrictions for this procedure.    Follow-Up:  Your physician wants you to follow-up in: 6 MONTHS WITH DR CRENSHAW You will receive a reminder letter in the mail two months in advance. If you don't receive a letter, please call our office to schedule the follow-up appointment.      

## 2018-06-23 ENCOUNTER — Other Ambulatory Visit: Payer: Self-pay

## 2018-06-23 ENCOUNTER — Encounter: Payer: Self-pay | Admitting: Cardiology

## 2018-06-23 ENCOUNTER — Ambulatory Visit (HOSPITAL_COMMUNITY): Payer: BLUE CROSS/BLUE SHIELD | Attending: Cardiovascular Disease

## 2018-06-23 DIAGNOSIS — R072 Precordial pain: Secondary | ICD-10-CM | POA: Diagnosis not present

## 2018-06-23 DIAGNOSIS — I340 Nonrheumatic mitral (valve) insufficiency: Secondary | ICD-10-CM | POA: Diagnosis not present

## 2018-07-04 ENCOUNTER — Ambulatory Visit (INDEPENDENT_AMBULATORY_CARE_PROVIDER_SITE_OTHER): Payer: 59 | Admitting: Psychology

## 2018-07-04 DIAGNOSIS — F4323 Adjustment disorder with mixed anxiety and depressed mood: Secondary | ICD-10-CM | POA: Diagnosis not present

## 2018-08-15 ENCOUNTER — Ambulatory Visit: Payer: 59 | Admitting: Psychology

## 2018-08-15 DIAGNOSIS — F4323 Adjustment disorder with mixed anxiety and depressed mood: Secondary | ICD-10-CM

## 2018-09-11 ENCOUNTER — Ambulatory Visit: Payer: 59 | Admitting: Psychology

## 2018-09-11 DIAGNOSIS — F4323 Adjustment disorder with mixed anxiety and depressed mood: Secondary | ICD-10-CM

## 2018-10-09 ENCOUNTER — Ambulatory Visit: Payer: 59 | Admitting: Psychology

## 2018-11-10 ENCOUNTER — Ambulatory Visit: Payer: 59 | Admitting: Psychology

## 2018-11-10 DIAGNOSIS — F4323 Adjustment disorder with mixed anxiety and depressed mood: Secondary | ICD-10-CM | POA: Diagnosis not present

## 2018-12-11 ENCOUNTER — Ambulatory Visit (INDEPENDENT_AMBULATORY_CARE_PROVIDER_SITE_OTHER): Payer: 59

## 2018-12-11 DIAGNOSIS — Z23 Encounter for immunization: Secondary | ICD-10-CM

## 2019-01-10 ENCOUNTER — Ambulatory Visit: Payer: 59 | Admitting: Psychology

## 2019-01-10 DIAGNOSIS — F411 Generalized anxiety disorder: Secondary | ICD-10-CM | POA: Diagnosis not present

## 2019-02-22 ENCOUNTER — Ambulatory Visit: Payer: 59 | Admitting: Psychology

## 2019-02-22 DIAGNOSIS — F411 Generalized anxiety disorder: Secondary | ICD-10-CM | POA: Diagnosis not present

## 2019-03-30 ENCOUNTER — Ambulatory Visit: Payer: 59 | Admitting: Psychology

## 2019-04-03 ENCOUNTER — Ambulatory Visit (INDEPENDENT_AMBULATORY_CARE_PROVIDER_SITE_OTHER): Payer: 59 | Admitting: Psychology

## 2019-04-03 DIAGNOSIS — F411 Generalized anxiety disorder: Secondary | ICD-10-CM | POA: Diagnosis not present

## 2019-04-18 ENCOUNTER — Ambulatory Visit (INDEPENDENT_AMBULATORY_CARE_PROVIDER_SITE_OTHER): Payer: 59 | Admitting: Internal Medicine

## 2019-04-18 ENCOUNTER — Other Ambulatory Visit: Payer: Self-pay

## 2019-04-18 ENCOUNTER — Encounter: Payer: Self-pay | Admitting: Internal Medicine

## 2019-04-18 VITALS — Ht 65.5 in | Wt 145.0 lb

## 2019-04-18 DIAGNOSIS — K625 Hemorrhage of anus and rectum: Secondary | ICD-10-CM | POA: Diagnosis not present

## 2019-04-18 DIAGNOSIS — K644 Residual hemorrhoidal skin tags: Secondary | ICD-10-CM

## 2019-04-18 DIAGNOSIS — Z1211 Encounter for screening for malignant neoplasm of colon: Secondary | ICD-10-CM

## 2019-04-18 DIAGNOSIS — K648 Other hemorrhoids: Secondary | ICD-10-CM

## 2019-04-18 DIAGNOSIS — Z800 Family history of malignant neoplasm of digestive organs: Secondary | ICD-10-CM

## 2019-04-18 NOTE — Progress Notes (Signed)
HISTORY OF PRESENT ILLNESS:  Danielle Weeks is a 48 y.o. female who schedules this telemedicine visit at the request of Dr. Nadeen Landau [facility] surgery) regarding rectal bleeding and the need for colonoscopy.  Patient's husband is a patient of mine.  She reports that she has been aware that she has had an external hemorrhoid since the birth of her child in 2003.  Typically does not give her any significant issues.  She tells me that she was well until January of this year when she developed issues with constipation.  Passed a constipated stool then noticed blood which seem to be coming from the inside.  There was no associated rectal pain.  She was referred to Dr. Dema Severin and was evaluated February 20, 2019.  I have reviewed that office encounter.  By that time her problem had resolved.  She was noted to have external hemorrhoids with no other abnormalities on digital exam.  Anoscopy was performed and revealed small internal hemorrhoids.  Given the patient's age and possibility for sources other than hemorrhoids to account for bleeding, she was referred for colonoscopy.  Patient tells me that she continues to do well.  No further problems with bleeding.  No rectal or abdominal complaints.  There is a family history of colon cancer in her grandmother at an advanced age.  Review of outside laboratories from April 19, 2018 were unremarkable.  Review of the x-ray file reveals no relevant abnormalities.  REVIEW OF SYSTEMS:  All non-GI ROS negative tirelessly  Past Medical History:  Diagnosis Date  . ASCUS (atypical squamous cells of undetermined significance) on Pap smear    biopsied twice  . Chicken pox   . TMJ (dislocation of temporomandibular joint)   . UTI (lower urinary tract infection)     Past Surgical History:  Procedure Laterality Date  . LAPAROSCOPIC APPENDECTOMY N/A 06/29/2013   Procedure: APPENDECTOMY LAPAROSCOPIC;  Surgeon: Zenovia Jarred, MD;  Location: [place];  Service:  General;  Laterality: N/A;    Social History Otho Perl  reports that she has never smoked. She has never used smokeless tobacco. She reports current alcohol use. She reports that she does not use drugs.  family history includes Alcohol abuse in her maternal grandfather; Birth defects in her maternal grandmother; Cancer in her father; Colon cancer in her maternal grandmother; Leukemia in her paternal grandfather; Pancreatitis in her sister.  Allergies  Allergen Reactions  . Penicillins     GI upset       PHYSICAL EXAMINATION: Vital signs: Ht 5' 5.5" (1.664 m)   Wt 145 lb (65.8 kg)   BMI 23.76 kg/m   No additional exam possible with telemedicine visit  ASSESSMENT:  1.  Low volume rectal bleeding without recurrence.  Presumably due to hemorrhoids.  Rule out other anorectal source such as neoplasia 2.  Internal and external hemorrhoids 3.  Colon cancer screening.  Appropriate time.  Grandmother with colon cancer   PLAN:  1.  Continue with fiber and increase fluid as previously recommended 2.  SCHEDULE COLONOSCOPY WITH ME during an upcoming [place] session.The nature of the procedure, as well as the risks, benefits, and alternatives were carefully and thoroughly reviewed with the patient. Ample time for discussion and questions allowed. The patient understood, was satisfied, and agreed to proceed.  This telemedicine audiovisual WebEx appointment was initiated by the patient who also consented to the appointment.  During this visit the patient was in her home while I was in my  office.  She understands her may be an associated professional charge for the service  A copy of this consultation note to Dr. Dema Severin

## 2019-04-25 ENCOUNTER — Encounter: Payer: Self-pay | Admitting: Emergency Medicine

## 2019-05-01 ENCOUNTER — Other Ambulatory Visit: Payer: Self-pay | Admitting: Obstetrics and Gynecology

## 2019-05-01 DIAGNOSIS — Z1231 Encounter for screening mammogram for malignant neoplasm of breast: Secondary | ICD-10-CM

## 2019-05-01 NOTE — Telephone Encounter (Signed)
Patient called back would like to schedule in June

## 2019-05-04 ENCOUNTER — Telehealth: Payer: Self-pay

## 2019-05-04 ENCOUNTER — Other Ambulatory Visit: Payer: Self-pay

## 2019-05-04 DIAGNOSIS — K648 Other hemorrhoids: Secondary | ICD-10-CM

## 2019-05-04 DIAGNOSIS — K644 Residual hemorrhoidal skin tags: Secondary | ICD-10-CM

## 2019-05-04 DIAGNOSIS — K625 Hemorrhage of anus and rectum: Secondary | ICD-10-CM

## 2019-05-04 MED ORDER — NA SULFATE-K SULFATE-MG SULF 17.5-3.13-1.6 GM/177ML PO SOLN: 1.0000 | Freq: Once | ORAL | 0 refills | Status: AC

## 2019-05-04 NOTE — Telephone Encounter (Signed)
Colonoscopy has been scheduled with patient for 05-30-2019 @ 830 am. Suprep Rx sent in, amb. referral made. Pre visit scheduled for 05-14-2019. Pt aware this is over the phone and not in person

## 2019-05-14 ENCOUNTER — Other Ambulatory Visit: Payer: Self-pay

## 2019-05-14 ENCOUNTER — Ambulatory Visit: Payer: 59 | Admitting: *Deleted

## 2019-05-14 ENCOUNTER — Telehealth: Payer: Self-pay | Admitting: *Deleted

## 2019-05-14 VITALS — Ht 65.5 in | Wt 146.0 lb

## 2019-05-14 DIAGNOSIS — Z1211 Encounter for screening for malignant neoplasm of colon: Secondary | ICD-10-CM

## 2019-05-14 MED ORDER — NA SULFATE-K SULFATE-MG SULF 17.5-3.13-1.6 GM/177ML PO SOLN: 1.0000 | Freq: Once | ORAL | 0 refills | Status: AC

## 2019-05-14 NOTE — Telephone Encounter (Signed)
I spoke with Danielle Weeks,she stated, she only need to be seen if having problems.

## 2019-05-14 NOTE — Progress Notes (Signed)
No egg or soy allergy known to patient  No issues with past sedation with any surgeries  or procedures, no intubation problems  No diet pills per patient No home 02 use per patient  No blood thinners per patient  Pt denies issues with constipation  No A fib or A flutter  EMMI video sent to pt's e mail   Pt mailed amd My Chart  instruction packet to included paper to complete and mail back to [facility] with addressed and stamped envelope, Emmi video, copy of consent form to read and not return, and instructions. suprep $15  coupon mailed in packet. PV completed over the phone. Pt encouraged to call with questions or issues

## 2019-05-16 ENCOUNTER — Encounter: Payer: Self-pay | Admitting: Internal Medicine

## 2019-05-28 ENCOUNTER — Telehealth: Payer: Self-pay

## 2019-05-28 NOTE — Telephone Encounter (Signed)
Covid-19 screening questions  Have you traveled in the last 14 days? No. If yes where?  Do you now or have you had a fever in the last 14 days? No.  Do you have any respiratory symptoms of shortness of breath or cough now or in the last 14 days? No.  Do you have any family members or close contacts with diagnosed or suspected Covid-19 in the past 14 days? No.  Have you been tested for Covid-19 and found to be positive? No.       

## 2019-05-30 ENCOUNTER — Ambulatory Visit (AMBULATORY_SURGERY_CENTER): Payer: 59 | Admitting: Internal Medicine

## 2019-05-30 ENCOUNTER — Other Ambulatory Visit: Payer: Self-pay

## 2019-05-30 ENCOUNTER — Encounter: Payer: Self-pay | Admitting: Internal Medicine

## 2019-05-30 VITALS — BP 95/58 | HR 63 | Temp 98.7°F | Resp 15 | Ht 65.0 in | Wt 145.0 lb

## 2019-05-30 DIAGNOSIS — D120 Benign neoplasm of cecum: Secondary | ICD-10-CM

## 2019-05-30 DIAGNOSIS — D123 Benign neoplasm of transverse colon: Secondary | ICD-10-CM

## 2019-05-30 DIAGNOSIS — K625 Hemorrhage of anus and rectum: Secondary | ICD-10-CM

## 2019-05-30 DIAGNOSIS — K635 Polyp of colon: Secondary | ICD-10-CM

## 2019-05-30 DIAGNOSIS — Z1211 Encounter for screening for malignant neoplasm of colon: Secondary | ICD-10-CM

## 2019-05-30 MED ORDER — SODIUM CHLORIDE 0.9 % IV SOLN: 500.0000 mL | Freq: Once | INTRAVENOUS | Status: AC

## 2019-05-30 NOTE — Progress Notes (Signed)
Pt's states no medical or surgical changes since previsit or office visit. Initial temperatures: [facility] CMA Vital signs taken: [place]

## 2019-05-30 NOTE — Op Note (Signed)
[place] Patient Name: Danielle Weeks Procedure Date: 05/30/2019 9:15 AM MRN: [id number] Endoscopist: Docia Chuck. Henrene Pastor , MD Age: 48 Referring MD:  Date of Birth: 19-Sep-1971 Gender: Female Account #: [id number] Procedure:                Colonoscopy with cold snare polypectomy x 2 Indications:              Screening for colorectal malignant neoplasm.                            Incidental minor (transient) rectal bleeding Medicines:                Monitored Anesthesia Care Procedure:                Pre-Anesthesia Assessment:                           - Prior to the procedure, a History and Physical                            was performed, and patient medications and                            allergies were reviewed. The patient's tolerance of                            previous anesthesia was also reviewed. The risks                            and benefits of the procedure and the sedation                            options and risks were discussed with the patient.                            All questions were answered, and informed consent                            was obtained. Prior Anticoagulants: The patient has                            taken no previous anticoagulant or antiplatelet                            agents. ASA Grade Assessment: I - A normal, healthy                            patient. After reviewing the risks and benefits,                            the patient was deemed in satisfactory condition to                            undergo the procedure.  After obtaining informed consent, the colonoscope                            was passed under direct vision. Throughout the                            procedure, the patient's blood pressure, pulse, and                            oxygen saturations were monitored continuously. The                            Colonoscope was introduced through the anus and   advanced to the the cecum, identified by                            appendiceal orifice and ileocecal valve. The                            ileocecal valve, appendiceal orifice, and rectum                            were photographed. The quality of the bowel                            preparation was excellent. The colonoscopy was                            performed without difficulty. The patient tolerated                            the procedure well. The bowel preparation used was                            SUPREP via split dose instruction. Scope In: 9:23:43 AM Scope Out: 9:38:25 AM Scope Withdrawal Time: 0 hours 10 minutes 57 seconds  Total Procedure Duration: 0 hours 14 minutes 42 seconds  Findings:                 Two polyps were found in the transverse colon and                            cecum. The polyps were 1 to 2 mm in size. These                            polyps were removed with a cold snare. Resection                            and retrieval were complete.                           External and internal hemorrhoids were found during  retroflexion.                           The exam was otherwise without abnormality on                            direct and retroflexion views. Complications:            No immediate complications. Estimated blood loss:                            None. Estimated Blood Loss:     Estimated blood loss: none. Impression:               - Two 1 to 2 mm polyps in the transverse colon and                            in the cecum, removed with a cold snare. Resected                            and retrieved.                           - External and internal hemorrhoids.                           - The examination was otherwise normal on direct                            and retroflexion views. Recommendation:           - Repeat colonoscopy in 10 years for surveillance.                           - Patient has a contact  number available for                            emergencies. The signs and symptoms of potential                            delayed complications were discussed with the                            patient. Return to normal activities tomorrow.                            Written discharge instructions were provided to the                            patient.                           - Resume previous diet.                           - Continue present medications.                           -  Await pathology results. Docia Chuck. Henrene Pastor, MD 05/30/2019 9:42:52 AM This report has been signed electronically.

## 2019-05-30 NOTE — Progress Notes (Signed)
A and O x3. Report to RN. Tolerated MAC anesthesia well.

## 2019-05-30 NOTE — Patient Instructions (Signed)
YOU HAD AN ENDOSCOPIC PROCEDURE TODAY AT THE [facility]:   Refer to the procedure report that was given to you for any specific questions about what was found during the examination.  If the procedure report does not answer your questions, please call your gastroenterologist to clarify.  If you requested that your care partner not be given the details of your procedure findings, then the procedure report has been included in a sealed envelope for you to review at your convenience later.  **Handout given on polyps**   YOU SHOULD EXPECT: Some feelings of bloating in the abdomen. Passage of more gas than usual.  Walking can help get rid of the air that was put into your GI tract during the procedure and reduce the bloating. If you had a lower endoscopy (such as a colonoscopy or flexible sigmoidoscopy) you may notice spotting of blood in your stool or on the toilet paper. If you underwent a bowel prep for your procedure, you may not have a normal bowel movement for a few days.  Please Note:  You might notice some irritation and congestion in your nose or some drainage.  This is from the oxygen used during your procedure.  There is no need for concern and it should clear up in a day or so.  SYMPTOMS TO REPORT IMMEDIATELY:   Following lower endoscopy (colonoscopy or flexible sigmoidoscopy):  Excessive amounts of blood in the stool  Significant tenderness or worsening of abdominal pains  Swelling of the abdomen that is new, acute  Fever of 100F or higher   For urgent or emergent issues, a gastroenterologist can be reached at any hour by calling [phone].   DIET:  We do recommend a small meal at first, but then you may proceed to your regular diet.  Drink plenty of fluids but you should avoid alcoholic beverages for 24 hours.  ACTIVITY:  You should plan to take it easy for the rest of today and you should NOT DRIVE or use heavy machinery until tomorrow (because of the sedation  medicines used during the test).    FOLLOW UP: Our staff will call the number listed on your records 48-72 hours following your procedure to check on you and address any questions or concerns that you may have regarding the information given to you following your procedure. If we do not reach you, we will leave a message.  We will attempt to reach you two times.  During this call, we will ask if you have developed any symptoms of COVID 19. If you develop any symptoms (ie: fever, flu-like symptoms, shortness of breath, cough etc.) before then, please call [phone].  If you test positive for Covid 19 in the 2 weeks post procedure, please call and report this information to us.    If any biopsies were taken you will be contacted by phone or by letter within the next 1-3 weeks.  Please call us at [phone] if you have not heard about the biopsies in 3 weeks.    SIGNATURES/CONFIDENTIALITY: You and/or your care partner have signed paperwork which will be entered into your electronic medical record.  These signatures attest to the fact that that the information above on your After Visit Summary has been reviewed and is understood.  Full responsibility of the confidentiality of this discharge information lies with you and/or your care-partner. 

## 2019-05-30 NOTE — Progress Notes (Signed)
Called to room to assist during endoscopic procedure.  Patient ID and intended procedure confirmed with present staff. Received instructions for my participation in the procedure from the performing physician.  

## 2019-05-31 ENCOUNTER — Ambulatory Visit (INDEPENDENT_AMBULATORY_CARE_PROVIDER_SITE_OTHER): Payer: 59 | Admitting: Psychology

## 2019-05-31 DIAGNOSIS — F411 Generalized anxiety disorder: Secondary | ICD-10-CM

## 2019-06-01 ENCOUNTER — Telehealth: Payer: Self-pay | Admitting: *Deleted

## 2019-06-01 NOTE — Telephone Encounter (Signed)
  Follow up Call-  Call back number 05/30/2019  Post procedure Call Back phone  # [phone]  Permission to leave phone message Yes  Some recent data might be hidden     Patient questions:  Do you have a fever, pain , or abdominal swelling? No. Pain Score  0 *  Have you tolerated food without any problems? Yes.    Have you been able to return to your normal activities? Yes.    Do you have any questions about your discharge instructions: Diet   No. Medications  No. Follow up visit  No.  Do you have questions or concerns about your Care? No.  Actions: * If pain score is 4 or above: No action needed, pain <4.  1. Have you developed a fever since your procedure? No  2.   Have you had an respiratory symptoms (SOB or cough) since your procedure? No  3.   Have you tested positive for COVID 19 since your procedure No  4.   Have you had any family members/close contacts diagnosed with the COVID 19 since your procedure?  No   If yes to any of these questions please route to Joylene John, RN and Alphonsa Gin, RN.

## 2019-06-03 ENCOUNTER — Encounter: Payer: Self-pay | Admitting: Internal Medicine

## 2019-06-14 ENCOUNTER — Ambulatory Visit (INDEPENDENT_AMBULATORY_CARE_PROVIDER_SITE_OTHER): Payer: 59 | Admitting: Psychology

## 2019-06-14 DIAGNOSIS — F411 Generalized anxiety disorder: Secondary | ICD-10-CM | POA: Diagnosis not present

## 2019-06-25 ENCOUNTER — Ambulatory Visit
Admission: RE | Admit: 2019-06-25 | Discharge: 2019-06-25 | Disposition: A | Discharge status: Home / Self Care | Payer: 59 | Source: Ambulatory Visit | Attending: Obstetrics and Gynecology | Admitting: Obstetrics and Gynecology

## 2019-06-25 ENCOUNTER — Other Ambulatory Visit: Payer: Self-pay

## 2019-06-25 DIAGNOSIS — Z1231 Encounter for screening mammogram for malignant neoplasm of breast: Secondary | ICD-10-CM

## 2019-08-08 ENCOUNTER — Ambulatory Visit (INDEPENDENT_AMBULATORY_CARE_PROVIDER_SITE_OTHER): Payer: 59 | Admitting: Psychology

## 2019-08-08 DIAGNOSIS — F411 Generalized anxiety disorder: Secondary | ICD-10-CM | POA: Diagnosis not present

## 2019-10-03 ENCOUNTER — Ambulatory Visit (INDEPENDENT_AMBULATORY_CARE_PROVIDER_SITE_OTHER): Payer: 59 | Admitting: Psychology

## 2019-10-03 DIAGNOSIS — F411 Generalized anxiety disorder: Secondary | ICD-10-CM | POA: Diagnosis not present

## 2020-01-02 ENCOUNTER — Ambulatory Visit (INDEPENDENT_AMBULATORY_CARE_PROVIDER_SITE_OTHER): Payer: 59 | Admitting: Psychology

## 2020-01-02 DIAGNOSIS — F411 Generalized anxiety disorder: Secondary | ICD-10-CM | POA: Diagnosis not present

## 2020-02-27 ENCOUNTER — Ambulatory Visit (INDEPENDENT_AMBULATORY_CARE_PROVIDER_SITE_OTHER): Payer: 59 | Admitting: Psychology

## 2020-02-27 DIAGNOSIS — F411 Generalized anxiety disorder: Secondary | ICD-10-CM | POA: Diagnosis not present

## 2020-02-28 ENCOUNTER — Encounter: Payer: Self-pay | Admitting: Family Medicine

## 2020-03-06 IMAGING — MG DIGITAL SCREENING BILATERAL MAMMOGRAM WITH TOMO AND CAD
1 series · 1 of 1 positions shown · non-contrast
Comparison: Previous exam(s).

CLINICAL DATA: Screening.

EXAM:
DIGITAL SCREENING BILATERAL MAMMOGRAM WITH TOMO AND CAD

[R MLO synth-2D]
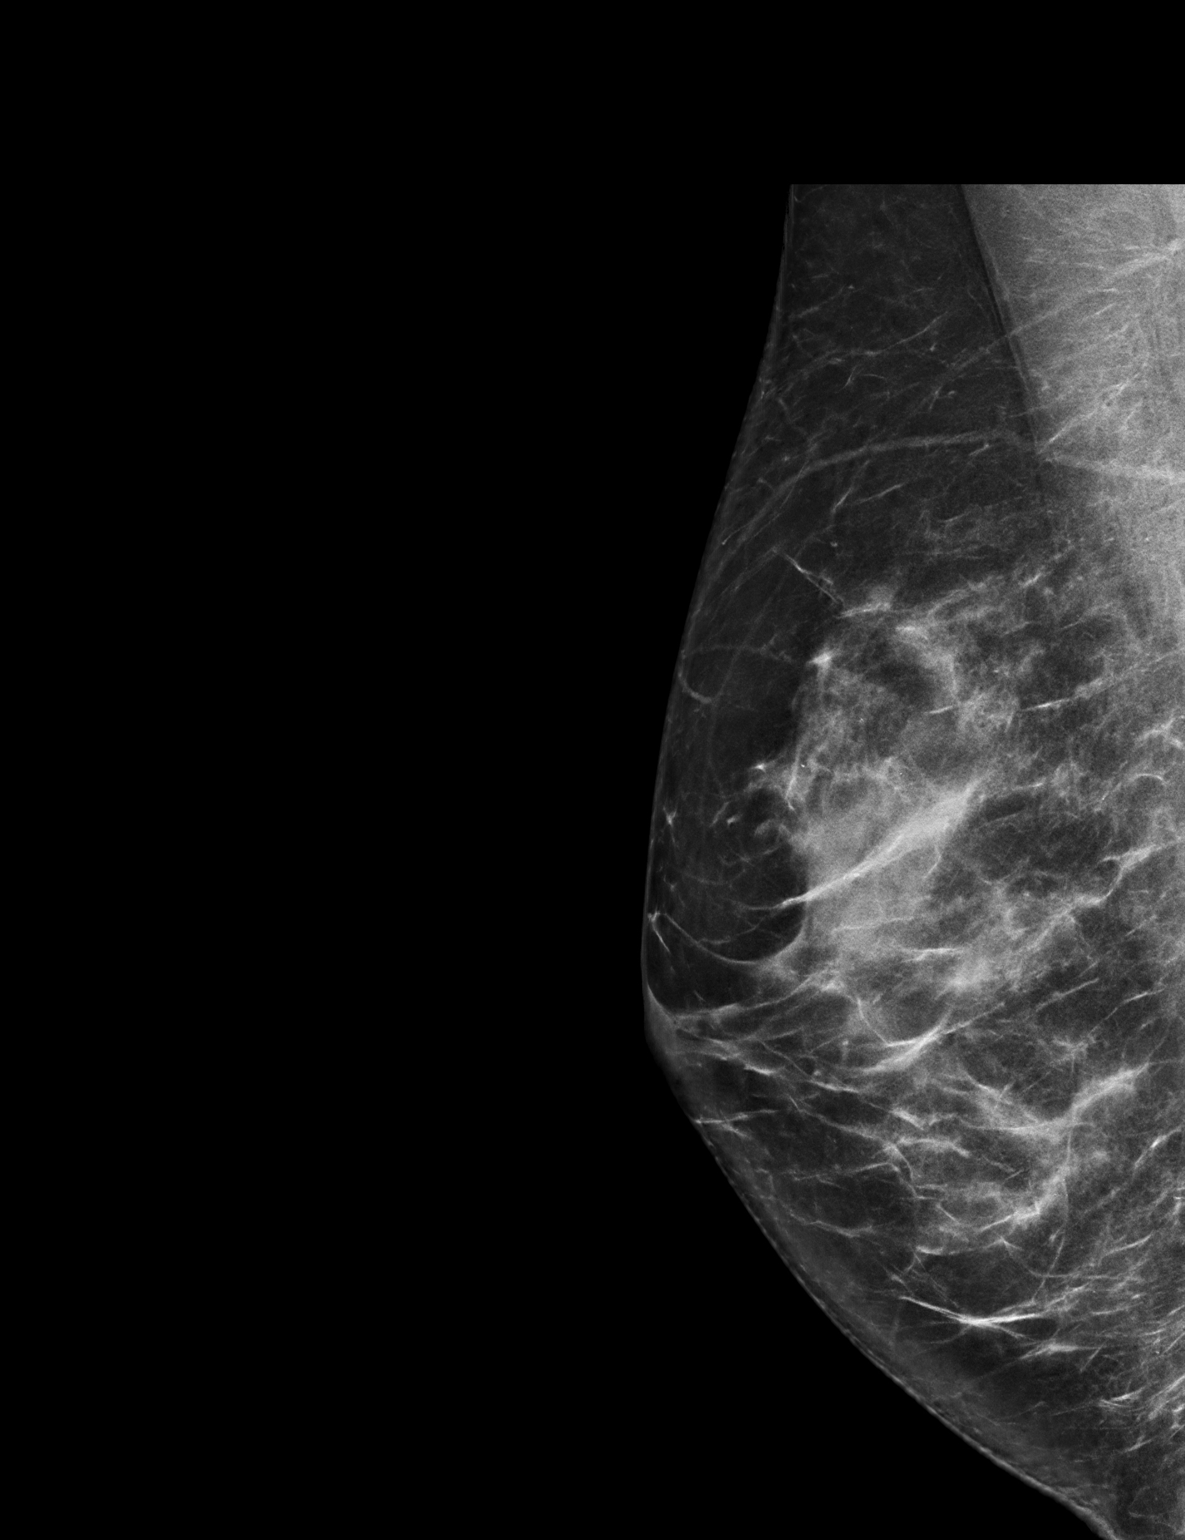

[1 of 1 positions shown; findings below may reference images not displayed]

ACR Breast Density Category c: The breast tissue is heterogeneously
dense, which may obscure small masses.
FINDINGS: In the left breast, a possible asymmetry warrants further
evaluation. In the right breast, no findings suspicious for
malignancy. Images were processed with CAD.
IMPRESSION: Further evaluation is suggested for possible asymmetry in the left
breast.

RECOMMENDATION:
Diagnostic mammogram and possibly ultrasound of the left breast.
(Code:[id number]-881)

The patient will be contacted regarding the findings, and additional
imaging will be scheduled.

BI-RADS CATEGORY  0: Incomplete. Need additional imaging evaluation
and/or prior mammograms for comparison.

## 2020-04-22 ENCOUNTER — Ambulatory Visit (INDEPENDENT_AMBULATORY_CARE_PROVIDER_SITE_OTHER): Payer: 59 | Admitting: Psychology

## 2020-04-22 DIAGNOSIS — F411 Generalized anxiety disorder: Secondary | ICD-10-CM

## 2020-05-23 ENCOUNTER — Other Ambulatory Visit: Payer: Self-pay | Admitting: Obstetrics and Gynecology

## 2020-05-23 DIAGNOSIS — Z1231 Encounter for screening mammogram for malignant neoplasm of breast: Secondary | ICD-10-CM

## 2020-06-09 ENCOUNTER — Ambulatory Visit (INDEPENDENT_AMBULATORY_CARE_PROVIDER_SITE_OTHER): Payer: 59 | Admitting: Psychology

## 2020-06-09 DIAGNOSIS — F411 Generalized anxiety disorder: Secondary | ICD-10-CM

## 2020-07-08 ENCOUNTER — Ambulatory Visit
Admission: RE | Admit: 2020-07-08 | Discharge: 2020-07-08 | Disposition: A | Discharge status: Home / Self Care | Payer: PRIVATE HEALTH INSURANCE | Source: Ambulatory Visit | Attending: Obstetrics and Gynecology | Admitting: Obstetrics and Gynecology

## 2020-07-08 ENCOUNTER — Other Ambulatory Visit: Payer: Self-pay

## 2020-07-08 DIAGNOSIS — Z1231 Encounter for screening mammogram for malignant neoplasm of breast: Secondary | ICD-10-CM

## 2020-07-22 ENCOUNTER — Ambulatory Visit (INDEPENDENT_AMBULATORY_CARE_PROVIDER_SITE_OTHER): Payer: PRIVATE HEALTH INSURANCE | Admitting: Psychology

## 2020-07-22 DIAGNOSIS — F411 Generalized anxiety disorder: Secondary | ICD-10-CM

## 2020-10-21 ENCOUNTER — Ambulatory Visit (INDEPENDENT_AMBULATORY_CARE_PROVIDER_SITE_OTHER): Payer: No Typology Code available for payment source | Admitting: Psychology

## 2020-10-21 DIAGNOSIS — F411 Generalized anxiety disorder: Secondary | ICD-10-CM

## 2021-01-13 ENCOUNTER — Ambulatory Visit: Payer: Self-pay | Admitting: Psychology

## 2021-02-04 ENCOUNTER — Encounter: Payer: Self-pay | Admitting: Family Medicine

## 2021-02-04 ENCOUNTER — Ambulatory Visit (INDEPENDENT_AMBULATORY_CARE_PROVIDER_SITE_OTHER): Payer: PRIVATE HEALTH INSURANCE | Admitting: Family Medicine

## 2021-02-04 VITALS — BP 90/60 | HR 87 | Ht 65.5 in | Wt 139.0 lb

## 2021-02-04 DIAGNOSIS — Z0000 Encounter for general adult medical examination without abnormal findings: Secondary | ICD-10-CM | POA: Diagnosis not present

## 2021-02-04 LAB — HEPATIC FUNCTION PANEL
ALT: 14 U/L (ref 0–35)
AST: 10 U/L (ref 0–37)
Albumin: 4.5 g/dL (ref 3.5–5.2)
Alkaline Phosphatase: 76 U/L (ref 39–117)
Bilirubin, Direct: 0.1 mg/dL (ref 0.0–0.3)
Total Bilirubin: 0.6 mg/dL (ref 0.2–1.2)
Total Protein: 7 g/dL (ref 6.0–8.3)

## 2021-02-04 LAB — CBC WITH DIFFERENTIAL/PLATELET
Basophils Absolute: 0 10*3/uL (ref 0.0–0.1)
Basophils Relative: 0.8 % (ref 0.0–3.0)
Eosinophils Absolute: 0.2 10*3/uL (ref 0.0–0.7)
Eosinophils Relative: 3.2 % (ref 0.0–5.0)
HCT: 41.1 % (ref 36.0–46.0)
Hemoglobin: 13.6 g/dL (ref 12.0–15.0)
Lymphocytes Relative: 47.1 % — ABNORMAL HIGH (ref 12.0–46.0)
Lymphs Abs: 2.4 10*3/uL (ref 0.7–4.0)
MCHC: 33.1 g/dL (ref 30.0–36.0)
MCV: 85.7 fl (ref 78.0–100.0)
Monocytes Absolute: 0.5 10*3/uL (ref 0.1–1.0)
Monocytes Relative: 9.2 % (ref 3.0–12.0)
Neutro Abs: 2 10*3/uL (ref 1.4–7.7)
Neutrophils Relative %: 39.7 % — ABNORMAL LOW (ref 43.0–77.0)
Platelets: 255 10*3/uL (ref 150.0–400.0)
RBC: 4.8 Mil/uL (ref 3.87–5.11)
RDW: 13.9 % (ref 11.5–15.5)
WBC: 5.1 10*3/uL (ref 4.0–10.5)

## 2021-02-04 LAB — BASIC METABOLIC PANEL
BUN: 16 mg/dL (ref 6–23)
CO2: 31 mEq/L (ref 19–32)
Calcium: 9.9 mg/dL (ref 8.4–10.5)
Chloride: 101 mEq/L (ref 96–112)
Creatinine, Ser: 0.72 mg/dL (ref 0.40–1.20)
GFR: 97.25 mL/min (ref >60.00)
Glucose, Bld: 88 mg/dL (ref 70–99)
Potassium: 4.5 mEq/L (ref 3.5–5.1)
Sodium: 137 mEq/L (ref 135–145)

## 2021-02-04 LAB — VITAMIN D 25 HYDROXY (VIT D DEFICIENCY, FRACTURES): VITD: 29.94 ng/mL — ABNORMAL LOW (ref 30.00–100.00)

## 2021-02-04 LAB — LIPID PANEL
Cholesterol: 166 mg/dL (ref 0–200)
HDL: 77.4 mg/dL (ref >39.00)
LDL Cholesterol: 78 mg/dL (ref 0–99)
NonHDL: 88.48
Total CHOL/HDL Ratio: 2
Triglycerides: 52 mg/dL (ref 0.0–149.0)
VLDL: 10.4 mg/dL (ref 0.0–40.0)

## 2021-02-04 LAB — TSH: TSH: 3.14 u[IU]/mL (ref 0.35–4.50)

## 2021-02-04 NOTE — Progress Notes (Signed)
Established Patient Office Visit  Subjective:  Patient ID: Danielle Weeks, female    DOB: 1971-03-09  Age: 50 y.o. MRN: [id number]  CC:  Chief Complaint  Patient presents with  . Annual Exam    HPI KARIEL SKILLMAN presents for physical exam.  She sees gynecologist yearly and gets Pap smears and mammograms through them.  Has not had prior DEXA scan.  Does have some family history of osteoporosis.  She had recent colonoscopy which showed benign serrated polyp.  She had Covid infection back in January.  Relatively mild infection.  She has been vaccinated.  Fully recovered at this time.  Health maintenance reviewed.  No history of hepatitis C screening but low risk.  Pap smears are up-to-date.  She states she had Pap smear sometime in the past few months but does not have exact date.  Last mammogram 7/21.  Declines flu vaccine.  Tetanus due 2024.  No history of shingles vaccine but does turned 49 in April.  Family history-her father had history of acoustic neuroma and melanoma.  Apparently both her parents have had colon polyps.  She had a sister with pancreatitis.  Social history-married.  She has a daughter that just started at [employer] last year.  She is premed.  Never smoked.  No regular alcohol use.   Past Medical History:  Diagnosis Date  . Allergy   . ASCUS (atypical squamous cells of undetermined significance) on Pap smear    biopsied twice  . Bradycardia   . Chicken pox   . Hypotension   . TMJ (dislocation of temporomandibular joint)   . UTI (lower urinary tract infection)     Past Surgical History:  Procedure Laterality Date  . APPENDECTOMY    . LAPAROSCOPIC APPENDECTOMY N/A 06/29/2013   Procedure: APPENDECTOMY LAPAROSCOPIC;  Surgeon: Zenovia Jarred, MD;  Location: [facility] OR;  Service: General;  Laterality: N/A;  . NSVD     x 2    Family History  Problem Relation Age of Onset  . Cancer Father        acoustic neuroma and melanoma  . Colon polyps Father   . Colon  polyps Mother   . Birth defects Maternal Grandmother   . Colon cancer Maternal Grandmother   . Alcohol abuse Maternal Grandfather   . Leukemia Paternal Grandfather   . Pancreatitis Sister   . Breast cancer Neg Hx   . Esophageal cancer Neg Hx   . Rectal cancer Neg Hx   . Stomach cancer Neg Hx     Social History   Socioeconomic History  . Marital status: Married    Spouse name: Not on file  . Number of children: 2  . Years of education: Not on file  . Highest education level: Not on file  Occupational History  . Not on file  Tobacco Use  . Smoking status: Never Smoker  . Smokeless tobacco: Never Used  Vaping Use  . Vaping Use: Never used  Substance and Sexual Activity  . Alcohol use: Yes    Comment: occasional  . Drug use: No  . Sexual activity: Not on file  Other Topics Concern  . Not on file  Social History Narrative  . Not on file   Social Determinants of Health   Financial Resource Strain: Not on file  Food Insecurity: Not on file  Transportation Needs: Not on file  Physical Activity: Not on file  Stress: Not on file  Social Connections: Not on file  Intimate  Partner Violence: Not on file    Outpatient Medications Prior to Visit  Medication Sig Dispense Refill  . loratadine (CLARITIN) 10 MG tablet Take 10 mg by mouth daily as needed for allergies.    . nitrofurantoin, macrocrystal-monohydrate, (MACROBID) 100 MG capsule Take 100 mg by mouth at bedtime as needed.     Facility-Administered Medications Prior to Visit  Medication Dose Route Frequency Provider Last Rate Last Admin  . 0.9 %  sodium chloride infusion  500 mL Intravenous Once Irene Shipper, MD        Allergies  Allergen Reactions  . Penicillins     GI upset    ROS Review of Systems  Constitutional: Negative for activity change, appetite change, fatigue, fever and unexpected weight change.  HENT: Negative for ear pain, hearing loss, sore throat and trouble swallowing.   Eyes: Negative for  visual disturbance.  Respiratory: Negative for cough and shortness of breath.   Cardiovascular: Negative for chest pain and palpitations.  Gastrointestinal: Negative for abdominal pain, blood in stool, constipation and diarrhea.  Endocrine: Negative for polydipsia and polyuria.  Genitourinary: Negative for dysuria and hematuria.  Musculoskeletal: Negative for arthralgias, back pain and myalgias.  Skin: Negative for rash.  Neurological: Negative for dizziness, syncope and headaches.  Hematological: Negative for adenopathy.  Psychiatric/Behavioral: Negative for confusion and dysphoric mood.      Objective:    Physical Exam Constitutional:      Appearance: She is well-developed and well-nourished.  HENT:     Head: Normocephalic and atraumatic.     Right Ear: Tympanic membrane normal.     Left Ear: Tympanic membrane normal.  Eyes:     Extraocular Movements: EOM normal.     Pupils: Pupils are equal, round, and reactive to light.  Neck:     Thyroid: No thyromegaly.  Cardiovascular:     Rate and Rhythm: Normal rate and regular rhythm.     Heart sounds: Normal heart sounds. No murmur heard.   Pulmonary:     Effort: No respiratory distress.     Breath sounds: Normal breath sounds. No wheezing or rales.  Abdominal:     General: Bowel sounds are normal. There is no distension.     Palpations: Abdomen is soft. There is no mass.     Tenderness: There is no abdominal tenderness. There is no guarding or rebound.  Musculoskeletal:        General: No edema. Normal range of motion.     Cervical back: Normal range of motion and neck supple.     Right lower leg: No edema.     Left lower leg: No edema.  Lymphadenopathy:     Cervical: No cervical adenopathy.  Skin:    Findings: No rash.  Neurological:     Mental Status: She is alert and oriented to person, place, and time.     Cranial Nerves: No cranial nerve deficit.     Deep Tendon Reflexes: Reflexes normal.  Psychiatric:         Mood and Affect: Mood and affect normal.        Behavior: Behavior normal.        Thought Content: Thought content normal.        Judgment: Judgment normal.     BP 90/60   Pulse 87   Ht 5' 5.5" (1.664 m)   Wt 139 lb (63 kg)   SpO2 96%   BMI 22.78 kg/m  Wt Readings from Last 3 Encounters:  02/04/21  139 lb (63 kg)  05/30/19 145 lb (65.8 kg)  05/14/19 146 lb (66.2 kg)     Health Maintenance Due  Topic Date Due  . Hepatitis C Screening  Never done  . HIV Screening  Never done  . PAP SMEAR-Modifier  11/11/2018    There are no preventive care reminders to display for this patient.  Lab Results  Component Value Date   TSH 1.95 04/19/2018   Lab Results  Component Value Date   WBC 5.6 04/19/2018   HGB 13.3 04/19/2018   HCT 39.9 04/19/2018   MCV 86.0 04/19/2018   PLT 241.0 04/19/2018   Lab Results  Component Value Date   NA 138 04/19/2018   K 4.6 04/19/2018   CO2 32 04/19/2018   GLUCOSE 82 04/19/2018   BUN 9 04/19/2018   CREATININE 0.67 04/19/2018   BILITOT 0.6 04/19/2018   ALKPHOS 70 04/19/2018   AST 10 04/19/2018   ALT 15 04/19/2018   PROT 6.7 04/19/2018   ALBUMIN 4.1 04/19/2018   CALCIUM 9.2 04/19/2018   GFR 99.85 04/19/2018   Lab Results  Component Value Date   CHOL 150 04/19/2018   Lab Results  Component Value Date   HDL 71.30 04/19/2018   Lab Results  Component Value Date   LDLCALC 64 04/19/2018   Lab Results  Component Value Date   TRIG 73.0 04/19/2018   Lab Results  Component Value Date   CHOLHDL 2 04/19/2018   Lab Results  Component Value Date   HGBA1C 5.4 10/19/2011      Assessment & Plan:   Problem List Items Addressed This Visit   None   Visit Diagnoses    Physical exam    -  Primary   Relevant Orders   Basic metabolic panel   Lipid panel   CBC with Differential/Platelet   TSH   Hepatic function panel   Hep C Antibody   VITAMIN D 25 Hydroxy (Vit-D Deficiency, Fractures)    Generally healthy 50 year old female.  She  had recent colonoscopy which showed only a benign polyp with recommended 10-year follow-up.  We discussed the following health maintenance issues  -She plans to continue with GYN follow-up yearly -We discussed Shingrix vaccine and she will consider getting this later this year -Check hepatitis C antibody -Patient request vitamin D level and this will be added to her labs -Discussed importance of regular weightbearing exercise and daily calcium and vitamin D - we discussed flu vaccine and she declines.  No orders of the defined types were placed in this encounter.   Follow-up: No follow-ups on file.    Carolann Littler, MD

## 2021-02-04 NOTE — Patient Instructions (Addendum)
Preventive Care 84-51 Years Old, Female Preventive care refers to lifestyle choices and visits with your health care provider that can promote health and wellness. This includes:  A yearly physical exam. This is also called an annual wellness visit.  Regular dental and eye exams.  Immunizations.  Screening for certain conditions.  Healthy lifestyle choices, such as: ? Eating a healthy diet. ? Getting regular exercise. ? Not using drugs or products that contain nicotine and tobacco. ? Limiting alcohol use. What can I expect for my preventive care visit? Physical exam Your health care provider will check your:  Height and weight. These may be used to calculate your BMI (body mass index). BMI is a measurement that tells if you are at a healthy weight.  Heart rate and blood pressure.  Body temperature.  Skin for abnormal spots. Counseling Your health care provider may ask you questions about your:  Past medical problems.  Family's medical history.  Alcohol, tobacco, and drug use.  Emotional well-being.  Home life and relationship well-being.  Sexual activity.  Diet, exercise, and sleep habits.  Work and work Statistician.  Access to firearms.  Method of birth control.  Menstrual cycle.  Pregnancy history. What immunizations do I need? Vaccines are usually given at various ages, according to a schedule. Your health care provider will recommend vaccines for you based on your age, medical history, and lifestyle or other factors, such as travel or where you work.   What tests do I need? Blood tests  Lipid and cholesterol levels. These may be checked every 5 years, or more often if you are over 3 years old.  Hepatitis C test.  Hepatitis B test. Screening  Lung cancer screening. You may have this screening every year starting at age 73 if you have a 30-pack-year history of smoking and currently smoke or have quit within the past 15 years.  Colorectal cancer  screening. ? All adults should have this screening starting at age 52 and continuing until age 17. ? Your health care provider may recommend screening at age 49 if you are at increased risk. ? You will have tests every 1-10 years, depending on your results and the type of screening test.  Diabetes screening. ? This is done by checking your blood sugar (glucose) after you have not eaten for a while (fasting). ? You may have this done every 1-3 years.  Mammogram. ? This may be done every 1-2 years. ? Talk with your health care provider about when you should start having regular mammograms. This may depend on whether you have a family history of breast cancer.  BRCA-related cancer screening. This may be done if you have a family history of breast, ovarian, tubal, or peritoneal cancers.  Pelvic exam and Pap test. ? This may be done every 3 years starting at age 10. ? Starting at age 11, this may be done every 5 years if you have a Pap test in combination with an HPV test. Other tests  STD (sexually transmitted disease) testing, if you are at risk.  Bone density scan. This is done to screen for osteoporosis. You may have this scan if you are at high risk for osteoporosis. Talk with your health care provider about your test results, treatment options, and if necessary, the need for more tests. Follow these instructions at home: Eating and drinking  Eat a diet that includes fresh fruits and vegetables, whole grains, lean protein, and low-fat dairy products.  Take vitamin and mineral supplements  as recommended by your health care provider.  Do not drink alcohol if: ? Your health care provider tells you not to drink. ? You are pregnant, may be pregnant, or are planning to become pregnant.  If you drink alcohol: ? Limit how much you have to 0-1 drink a day. ? Be aware of how much alcohol is in your drink. In the [place] one drink equals one 12 oz bottle of beer (355 mL), one 5 oz glass of  wine (148 mL), or one 1 oz glass of hard liquor (44 mL).   Lifestyle  Take daily care of your teeth and gums. Brush your teeth every morning and night with fluoride toothpaste. Floss one time each day.  Stay active. Exercise for at least 30 minutes 5 or more days each week.  Do not use any products that contain nicotine or tobacco, such as cigarettes, e-cigarettes, and chewing tobacco. If you need help quitting, ask your health care provider.  Do not use drugs.  If you are sexually active, practice safe sex. Use a condom or other form of protection to prevent STIs (sexually transmitted infections).  If you do not wish to become pregnant, use a form of birth control. If you plan to become pregnant, see your health care provider for a prepregnancy visit.  If told by your health care provider, take low-dose aspirin daily starting at age 31.  Find healthy ways to cope with stress, such as: ? Meditation, yoga, or listening to music. ? Journaling. ? Talking to a trusted person. ? Spending time with friends and family. Safety  Always wear your seat belt while driving or riding in a vehicle.  Do not drive: ? If you have been drinking alcohol. Do not ride with someone who has been drinking. ? When you are tired or distracted. ? While texting.  Wear a helmet and other protective equipment during sports activities.  If you have firearms in your house, make sure you follow all gun safety procedures. What's next?  Visit your health care provider once a year for an annual wellness visit.  Ask your health care provider how often you should have your eyes and teeth checked.  Stay up to date on all vaccines. This information is not intended to replace advice given to you by your health care provider. Make sure you discuss any questions you have with your health care provider. Document Revised: 09/16/2020 Document Reviewed: 08/24/2018 Elsevier Patient Education  2021 [place].  Consider shingles vaccine at some point this year.

## 2021-02-05 LAB — HEPATITIS C ANTIBODY
Hepatitis C Ab: NONREACTIVE
SIGNAL TO CUT-OFF: 0.01 (ref <1.00)

## 2021-02-06 ENCOUNTER — Telehealth: Payer: Self-pay

## 2021-02-06 NOTE — Telephone Encounter (Signed)
Left message and send mychart message with results and recommendations.

## 2021-02-06 NOTE — Telephone Encounter (Signed)
-----   Message from Eulas Post, MD sent at 02/05/2021 12:17 PM EST ----- Labs all look great except for slightly low Vit D    I suggest taking OTC Vit D 2,000 IU daily.

## 2021-02-06 NOTE — Progress Notes (Signed)
Mychart message sent; Labs all look great except for slightly low Vit D    I suggest taking OTC Vit D 2,000 IU daily

## 2021-05-20 ENCOUNTER — Other Ambulatory Visit: Payer: PRIVATE HEALTH INSURANCE

## 2021-05-21 ENCOUNTER — Other Ambulatory Visit: Payer: PRIVATE HEALTH INSURANCE

## 2021-05-22 ENCOUNTER — Ambulatory Visit (INDEPENDENT_AMBULATORY_CARE_PROVIDER_SITE_OTHER): Payer: No Typology Code available for payment source | Admitting: Family Medicine

## 2021-05-22 ENCOUNTER — Encounter: Payer: Self-pay | Admitting: Family Medicine

## 2021-05-22 ENCOUNTER — Other Ambulatory Visit: Payer: Self-pay

## 2021-05-22 VITALS — BP 130/60 | HR 82 | Temp 97.7°F | Wt 143.5 lb

## 2021-05-22 DIAGNOSIS — R053 Chronic cough: Secondary | ICD-10-CM

## 2021-05-22 MED ORDER — BENZONATATE 100 MG PO CAPS: 100.0000 mg | ORAL_CAPSULE | Freq: Three times a day (TID) | ORAL | 0 refills | Status: DC | PRN

## 2021-05-22 NOTE — Progress Notes (Signed)
Established Patient Office Visit  Subjective:  Patient ID: Danielle Weeks, female    DOB: 1971/02/15  Age: 50 y.o. MRN: [id number]  CC: No chief complaint on file.   HPI [name] presents for chronic cough.  She states that in January she had COVID.  She felt very fatigued but eventually recovered.  Since that time she has had very sporadic cough usually once or twice per week usually only lasting for several minutes to few hours.  This is dry cough.  She is found some temporary relief with pineapple juice.  She does have occasional allergy symptoms but no consistent postnasal drip symptoms.  No wheezing.  No ACE inhibitor use.  No appetite or weight changes.  No hemoptysis.  No obvious GERD symptoms.  Never smoked.  Generally very healthy.  Overall feels well.  Past Medical History:  Diagnosis Date  . Allergy   . ASCUS (atypical squamous cells of undetermined significance) on Pap smear    biopsied twice  . Bradycardia   . Chicken pox   . Hypotension   . TMJ (dislocation of temporomandibular joint)   . UTI (lower urinary tract infection)     Past Surgical History:  Procedure Laterality Date  . APPENDECTOMY    . LAPAROSCOPIC APPENDECTOMY N/A 06/29/2013   Procedure: APPENDECTOMY LAPAROSCOPIC;  Surgeon: Zenovia Jarred, MD;  Location: [facility] OR;  Service: General;  Laterality: N/A;  . NSVD     x 2    Family History  Problem Relation Age of Onset  . Cancer Father        acoustic neuroma and melanoma  . Colon polyps Father   . Colon polyps Mother   . Birth defects Maternal Grandmother   . Colon cancer Maternal Grandmother   . Alcohol abuse Maternal Grandfather   . Leukemia Paternal Grandfather   . Pancreatitis Sister   . Breast cancer Neg Hx   . Esophageal cancer Neg Hx   . Rectal cancer Neg Hx   . Stomach cancer Neg Hx     Social History   Socioeconomic History  . Marital status: Married    Spouse name: Not on file  . Number of children: 2  . Years of  education: Not on file  . Highest education level: Not on file  Occupational History  . Not on file  Tobacco Use  . Smoking status: Never Smoker  . Smokeless tobacco: Never Used  Vaping Use  . Vaping Use: Never used  Substance and Sexual Activity  . Alcohol use: Yes    Comment: occasional  . Drug use: No  . Sexual activity: Not on file  Other Topics Concern  . Not on file  Social History Narrative  . Not on file   Social Determinants of Health   Financial Resource Strain: Not on file  Food Insecurity: Not on file  Transportation Needs: Not on file  Physical Activity: Not on file  Stress: Not on file  Social Connections: Not on file  Intimate Partner Violence: Not on file    Outpatient Medications Prior to Visit  Medication Sig Dispense Refill  . loratadine (CLARITIN) 10 MG tablet Take 10 mg by mouth daily as needed for allergies.    . nitrofurantoin, macrocrystal-monohydrate, (MACROBID) 100 MG capsule Take 100 mg by mouth at bedtime as needed.     Facility-Administered Medications Prior to Visit  Medication Dose Route Frequency Provider Last Rate Last Admin  . 0.9 %  sodium chloride infusion  500 mL Intravenous Once Irene Shipper, MD        Allergies  Allergen Reactions  . Penicillins     GI upset    ROS Review of Systems  Constitutional: Negative for appetite change, chills, fever and unexpected weight change.  HENT: Negative for sinus pressure.   Respiratory: Positive for cough. Negative for shortness of breath and wheezing.   Cardiovascular: Negative for chest pain and leg swelling.      Objective:    Physical Exam Vitals reviewed.  Constitutional:      Appearance: Normal appearance.  Cardiovascular:     Rate and Rhythm: Normal rate and regular rhythm.  Pulmonary:     Effort: Pulmonary effort is normal.     Breath sounds: Normal breath sounds. No wheezing or rales.  Neurological:     Mental Status: She is alert.     BP 130/60 (BP Location:  Left Arm, Patient Position: Sitting, Cuff Size: Normal)   Pulse 82   Temp 97.7 F (36.5 C) (Oral)   Wt 143 lb 8 oz (65.1 kg)   SpO2 97%   BMI 23.52 kg/m  Wt Readings from Last 3 Encounters:  05/22/21 143 lb 8 oz (65.1 kg)  02/04/21 139 lb (63 kg)  05/30/19 145 lb (65.8 kg)     Health Maintenance Due  Topic Date Due  . HIV Screening  Never done  . Zoster Vaccines- Shingrix (1 of 2) Never done    There are no preventive care reminders to display for this patient.  Lab Results  Component Value Date   TSH 3.14 02/04/2021   Lab Results  Component Value Date   WBC 5.1 02/04/2021   HGB 13.6 02/04/2021   HCT 41.1 02/04/2021   MCV 85.7 02/04/2021   PLT 255.0 02/04/2021   Lab Results  Component Value Date   NA 137 02/04/2021   K 4.5 02/04/2021   CO2 31 02/04/2021   GLUCOSE 88 02/04/2021   BUN 16 02/04/2021   CREATININE 0.72 02/04/2021   BILITOT 0.6 02/04/2021   ALKPHOS 76 02/04/2021   AST 10 02/04/2021   ALT 14 02/04/2021   PROT 7.0 02/04/2021   ALBUMIN 4.5 02/04/2021   CALCIUM 9.9 02/04/2021   GFR 97.25 02/04/2021   Lab Results  Component Value Date   CHOL 166 02/04/2021   Lab Results  Component Value Date   HDL 77.40 02/04/2021   Lab Results  Component Value Date   LDLCALC 78 02/04/2021   Lab Results  Component Value Date   TRIG 52.0 02/04/2021   Lab Results  Component Value Date   CHOLHDL 2 02/04/2021   Lab Results  Component Value Date   HGBA1C 5.4 10/19/2011      Assessment & Plan:   Chronic cough.  She did have COVID back in January and cough seem to start at that time.  She is not have any red flag symptoms such as weight loss, hemoptysis, dyspnea.  We went over differential including postnasal drip cough, silent GERD, versus other.  She does not have any obvious wheezing.  We discussed getting chest x-ray PA and lateral given duration of symptoms -Recommend trial of Tessalon Perles 100 mg every 8 hours as needed for cough -Consider  possible use of chlorpheniramine 4 mg nightly and/or Flonase nasal for any postnasal drip symptoms -Be in touch if cough not improving next few weeks with the above  Meds ordered this encounter  Medications  . benzonatate (TESSALON PERLES) 100 MG  capsule    Sig: Take 1 capsule (100 mg total) by mouth 3 (three) times daily as needed for cough.    Dispense:  30 capsule    Refill:  0    Follow-up: No follow-ups on file.    Carolann Littler, MD

## 2021-05-22 NOTE — Patient Instructions (Signed)

## 2021-05-26 ENCOUNTER — Ambulatory Visit (INDEPENDENT_AMBULATORY_CARE_PROVIDER_SITE_OTHER)
Admission: RE | Admit: 2021-05-26 | Discharge: 2021-05-26 | Disposition: A | Discharge status: Home / Self Care | Payer: No Typology Code available for payment source | Source: Ambulatory Visit | Attending: Family Medicine | Admitting: Family Medicine

## 2021-05-26 ENCOUNTER — Other Ambulatory Visit: Payer: Self-pay

## 2021-05-26 DIAGNOSIS — R053 Chronic cough: Secondary | ICD-10-CM

## 2021-07-15 ENCOUNTER — Ambulatory Visit
Admission: RE | Admit: 2021-07-15 | Discharge: 2021-07-15 | Disposition: A | Discharge status: Home / Self Care | Payer: No Typology Code available for payment source | Source: Ambulatory Visit | Attending: Obstetrics and Gynecology | Admitting: Obstetrics and Gynecology

## 2021-07-15 ENCOUNTER — Other Ambulatory Visit: Payer: Self-pay

## 2021-07-15 ENCOUNTER — Other Ambulatory Visit: Payer: Self-pay | Admitting: Obstetrics and Gynecology

## 2021-07-15 DIAGNOSIS — Z1231 Encounter for screening mammogram for malignant neoplasm of breast: Secondary | ICD-10-CM

## 2022-04-12 ENCOUNTER — Encounter: Payer: No Typology Code available for payment source | Admitting: Family Medicine

## 2022-04-28 ENCOUNTER — Encounter: Payer: Self-pay | Admitting: Family Medicine

## 2022-04-28 ENCOUNTER — Ambulatory Visit (INDEPENDENT_AMBULATORY_CARE_PROVIDER_SITE_OTHER): Payer: No Typology Code available for payment source | Admitting: Family Medicine

## 2022-04-28 VITALS — BP 100/66 | HR 79 | Temp 97.9°F | Ht 64.96 in | Wt 147.7 lb

## 2022-04-28 DIAGNOSIS — Z0000 Encounter for general adult medical examination without abnormal findings: Secondary | ICD-10-CM

## 2022-04-28 LAB — VITAMIN D 25 HYDROXY (VIT D DEFICIENCY, FRACTURES): VITD: 28.48 ng/mL — ABNORMAL LOW (ref 30.00–100.00)

## 2022-04-28 LAB — BASIC METABOLIC PANEL
BUN: 11 mg/dL (ref 6–23)
CO2: 30 mEq/L (ref 19–32)
Calcium: 9.6 mg/dL (ref 8.4–10.5)
Chloride: 103 mEq/L (ref 96–112)
Creatinine, Ser: 0.74 mg/dL (ref 0.40–1.20)
GFR: 93.29 mL/min (ref >60.00)
Glucose, Bld: 85 mg/dL (ref 70–99)
Potassium: 3.9 mEq/L (ref 3.5–5.1)
Sodium: 139 mEq/L (ref 135–145)

## 2022-04-28 LAB — HEPATIC FUNCTION PANEL
ALT: 22 U/L (ref 0–35)
AST: 15 U/L (ref 0–37)
Albumin: 4.5 g/dL (ref 3.5–5.2)
Alkaline Phosphatase: 79 U/L (ref 39–117)
Bilirubin, Direct: 0.1 mg/dL (ref 0.0–0.3)
Total Bilirubin: 0.5 mg/dL (ref 0.2–1.2)
Total Protein: 7.4 g/dL (ref 6.0–8.3)

## 2022-04-28 LAB — CBC WITH DIFFERENTIAL/PLATELET
Basophils Absolute: 0.1 10*3/uL (ref 0.0–0.1)
Basophils Relative: 0.9 % (ref 0.0–3.0)
Eosinophils Absolute: 0.4 10*3/uL (ref 0.0–0.7)
Eosinophils Relative: 6.1 % — ABNORMAL HIGH (ref 0.0–5.0)
HCT: 42.1 % (ref 36.0–46.0)
Hemoglobin: 13.7 g/dL (ref 12.0–15.0)
Lymphocytes Relative: 47.6 % — ABNORMAL HIGH (ref 12.0–46.0)
Lymphs Abs: 2.8 10*3/uL (ref 0.7–4.0)
MCHC: 32.5 g/dL (ref 30.0–36.0)
MCV: 85.9 fl (ref 78.0–100.0)
Monocytes Absolute: 0.5 10*3/uL (ref 0.1–1.0)
Monocytes Relative: 9 % (ref 3.0–12.0)
Neutro Abs: 2.1 10*3/uL (ref 1.4–7.7)
Neutrophils Relative %: 36.4 % — ABNORMAL LOW (ref 43.0–77.0)
Platelets: 262 10*3/uL (ref 150.0–400.0)
RBC: 4.9 Mil/uL (ref 3.87–5.11)
RDW: 14.9 % (ref 11.5–15.5)
WBC: 5.8 10*3/uL (ref 4.0–10.5)

## 2022-04-28 LAB — LIPID PANEL
Cholesterol: 174 mg/dL (ref 0–200)
HDL: 78.9 mg/dL (ref >39.00)
LDL Cholesterol: 84 mg/dL (ref 0–99)
NonHDL: 95.56
Total CHOL/HDL Ratio: 2
Triglycerides: 59 mg/dL (ref 0.0–149.0)
VLDL: 11.8 mg/dL (ref 0.0–40.0)

## 2022-04-28 LAB — TSH: TSH: 3.65 u[IU]/mL (ref 0.35–5.50)

## 2022-04-28 NOTE — Progress Notes (Signed)
? ?Established Patient Office Visit ? ?Subjective   ?Patient ID: Danielle Weeks, female    DOB: 25-Jul-1971  Age: 51 y.o. MRN: [id number] ? ?Chief Complaint  ?Patient presents with  ? Annual Exam  ? ? ?HPI ?  ?Danielle Weeks is here for physical exam.  Generally very healthy.  She sees GYN yearly.  She states she had abnormal Pap smear and in March had colposcopy but fortunately biopsies were negative.  She had tendencies toward low blood pressure but never has any associated dizziness.  Takes no regular medications.  She continues to work as a Oceanographer. ? ?Health maintenance reviewed ? ?-No history of shingles vaccine ?-Tetanus is due 2024 ?-Pap smear and mammogram up-to-date ?-Colonoscopy due 2030 ?-Hepatitis C screen negative. ? ?Family history-Sister with idiopathic recurrent pancreatitis.  Her father has history of acoustic neuroma and melanoma.  Both parents have had colon polyps. ? ?Social history she is married.  She has 2 daughters.  She has 1 still at home finishing high school next year and the other is at [employer] and is either premed or pre-PA tract.  Patient has never smoked.  No regular alcohol use.  Works as a Cabin crew. ? ?Past Medical History:  ?Diagnosis Date  ? Allergy   ? ASCUS (atypical squamous cells of undetermined significance) on Pap smear   ? biopsied twice  ? Bradycardia   ? Chicken pox   ? Hypotension   ? TMJ (dislocation of temporomandibular joint)   ? UTI (lower urinary tract infection)   ? ?Past Surgical History:  ?Procedure Laterality Date  ? APPENDECTOMY    ? LAPAROSCOPIC APPENDECTOMY N/A 06/29/2013  ? Procedure: APPENDECTOMY LAPAROSCOPIC;  Surgeon: Zenovia Jarred, MD;  Location: [facility];  Service: General;  Laterality: N/A;  ? NSVD    ? x 2  ? ? reports that she has never smoked. She has never used smokeless tobacco. She reports current alcohol use. She reports that she does not use drugs. ?family history includes Alcohol abuse in her maternal grandfather; Birth defects in her maternal  grandmother; Cancer in her father; Colon cancer in her maternal grandmother; Colon polyps in her father and mother; Leukemia in her paternal grandfather; Pancreatitis in her sister. ?Allergies  ?Allergen Reactions  ? Penicillins   ?  GI upset  ? ? ? ? ?Review of Systems  ?Constitutional:  Negative for chills, fever and weight loss.  ?Respiratory:  Negative for cough, shortness of breath and wheezing.   ?Cardiovascular:  Negative for chest pain.  ?Gastrointestinal:  Negative for blood in stool, constipation, diarrhea, melena, nausea and vomiting.  ?Genitourinary:  Negative for dysuria.  ?Neurological:  Negative for dizziness and headaches.  ?Psychiatric/Behavioral:  Negative for depression.   ? ?  ?Objective:  ?  ? ?BP 100/66 (BP Location: Left Arm, Patient Position: Sitting, Cuff Size: Normal)   Pulse 79   Temp 97.9 ?F (36.6 ?C) (Oral)   Ht 5' 4.96" (1.65 m)   Wt 147 lb 11.2 oz (67 kg)   SpO2 97%   BMI 24.61 kg/m?  ?Wt Readings from Last 3 Encounters:  ?04/28/22 147 lb 11.2 oz (67 kg)  ?05/22/21 143 lb 8 oz (65.1 kg)  ?02/04/21 139 lb (63 kg)  ? ?  ? ?Physical Exam ?Vitals reviewed.  ?Constitutional:   ?   General: She is not in acute distress. ?   Appearance: Normal appearance. She is not ill-appearing or toxic-appearing.  ?HENT:  ?   Right Ear: Tympanic membrane normal.  ?  Left Ear: Tympanic membrane normal.  ?   Mouth/Throat:  ?   Mouth: Mucous membranes are moist.  ?   Pharynx: Oropharynx is clear. No oropharyngeal exudate or posterior oropharyngeal erythema.  ?Cardiovascular:  ?   Rate and Rhythm: Normal rate and regular rhythm.  ?   Heart sounds: No murmur heard. ?Pulmonary:  ?   Effort: Pulmonary effort is normal.  ?   Breath sounds: Normal breath sounds. No wheezing or rales.  ?Abdominal:  ?   General: Bowel sounds are normal. There is no distension.  ?   Palpations: Abdomen is soft. There is no mass.  ?   Tenderness: There is no abdominal tenderness. There is no guarding or rebound.   ?Musculoskeletal:  ?   Cervical back: Neck supple.  ?   Right lower leg: No edema.  ?   Left lower leg: No edema.  ?Lymphadenopathy:  ?   Cervical: No cervical adenopathy.  ?Skin: ?   Findings: No rash.  ?Neurological:  ?   Mental Status: She is alert.  ?Psychiatric:     ?   Mood and Affect: Mood normal.     ?   Thought Content: Thought content normal.     ?   Judgment: Judgment normal.  ? ? ? ?No results found for any visits on 04/28/22. ? ?Last CBC ?Lab Results  ?Component Value Date  ? WBC 5.1 02/04/2021  ? HGB 13.6 02/04/2021  ? HCT 41.1 02/04/2021  ? MCV 85.7 02/04/2021  ? MCH 29.2 06/28/2013  ? RDW 13.9 02/04/2021  ? PLT 255.0 02/04/2021  ? ?Last metabolic panel ?Lab Results  ?Component Value Date  ? GLUCOSE 88 02/04/2021  ? NA 137 02/04/2021  ? K 4.5 02/04/2021  ? CL 101 02/04/2021  ? CO2 31 02/04/2021  ? BUN 16 02/04/2021  ? CREATININE 0.72 02/04/2021  ? GFRNONAA >90 06/28/2013  ? CALCIUM 9.9 02/04/2021  ? PROT 7.0 02/04/2021  ? ALBUMIN 4.5 02/04/2021  ? BILITOT 0.6 02/04/2021  ? ALKPHOS 76 02/04/2021  ? AST 10 02/04/2021  ? ALT 14 02/04/2021  ? ?Last lipids ?Lab Results  ?Component Value Date  ? CHOL 166 02/04/2021  ? HDL 77.40 02/04/2021  ? [place] 78 02/04/2021  ? TRIG 52.0 02/04/2021  ? CHOLHDL 2 02/04/2021  ? ?Last thyroid functions ?Lab Results  ?Component Value Date  ? TSH 3.14 02/04/2021  ? ?Last vitamin D ?Lab Results  ?Component Value Date  ? VD25OH 29.94 (L) 02/04/2021  ? ?  ? ?The 10-year ASCVD risk score (Arnett DK, et al., 2019) is: 0.5% ? ?  ?Assessment & Plan:  ? ?Problem List Items Addressed This Visit   ?None ?Visit Diagnoses   ? ? Physical exam    -  Primary  ? Relevant Orders  ? Basic metabolic panel  ? Lipid panel  ? CBC with Differential/Platelet  ? TSH  ? Hepatic function panel  ? VITAMIN D 25 Hydroxy (Vit-D Deficiency, Fractures)  ? ?  ?Healthy 51 year old female.  We discussed importance of regular weightbearing exercise.  Also recommend she consider Shingrix vaccine.  She will  check on coverage.  Obtain screening labs as above.  She plans to continue GYN follow-up for Pap smears and mammograms. ? ?No follow-ups on file.  ? ? ?Carolann Littler, MD ? ?

## 2022-04-29 ENCOUNTER — Encounter: Payer: Self-pay | Admitting: Family Medicine

## 2022-06-01 ENCOUNTER — Other Ambulatory Visit: Payer: Self-pay | Admitting: Obstetrics and Gynecology

## 2022-06-01 DIAGNOSIS — Z1231 Encounter for screening mammogram for malignant neoplasm of breast: Secondary | ICD-10-CM

## 2022-07-16 ENCOUNTER — Ambulatory Visit
Admission: RE | Admit: 2022-07-16 | Discharge: 2022-07-16 | Disposition: A | Discharge status: Home / Self Care | Payer: 59 | Source: Ambulatory Visit | Attending: Obstetrics and Gynecology | Admitting: Obstetrics and Gynecology

## 2022-07-16 ENCOUNTER — Encounter: Payer: Self-pay | Admitting: Radiology

## 2022-07-16 DIAGNOSIS — Z1231 Encounter for screening mammogram for malignant neoplasm of breast: Secondary | ICD-10-CM

## 2022-07-16 LAB — HM MAMMOGRAPHY

## 2022-07-20 ENCOUNTER — Encounter: Payer: Self-pay | Admitting: Family Medicine

## 2023-05-17 IMAGING — MG MM DIGITAL SCREENING BILAT W/ TOMO AND CAD
8 series · 9 of 24 positions shown · non-contrast
Comparison: Previous exam(s).

CLINICAL DATA: Screening.

EXAM:
DIGITAL SCREENING BILATERAL MAMMOGRAM WITH TOMOSYNTHESIS AND CAD
TECHNIQUE: Bilateral screening digital craniocaudal and mediolateral oblique
mammograms were obtained. Bilateral screening digital breast
tomosynthesis was performed. The images were evaluated with
computer-aided detection.

[L MLO synth-2D]
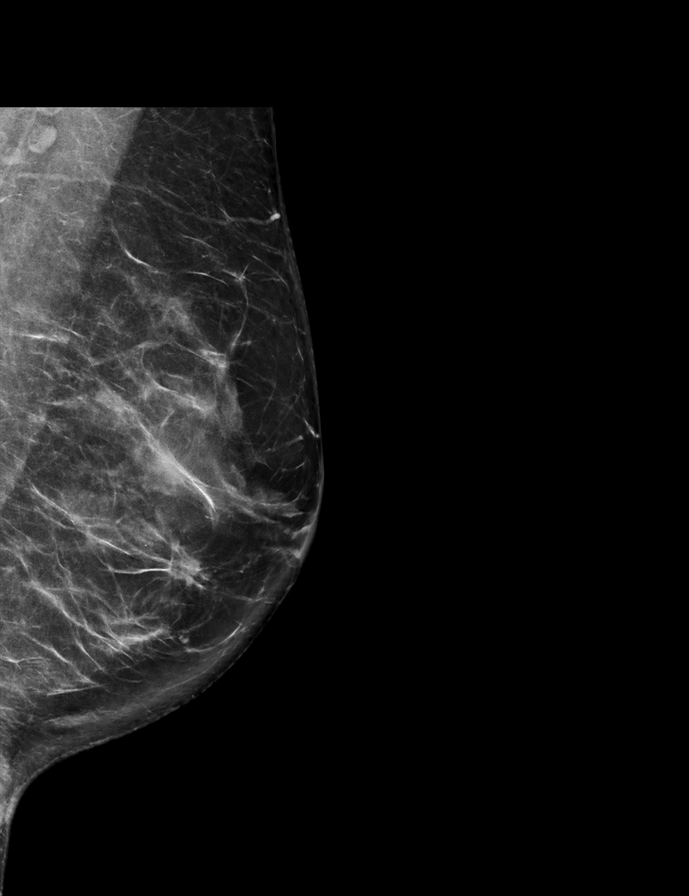

[L CC synth-2D]
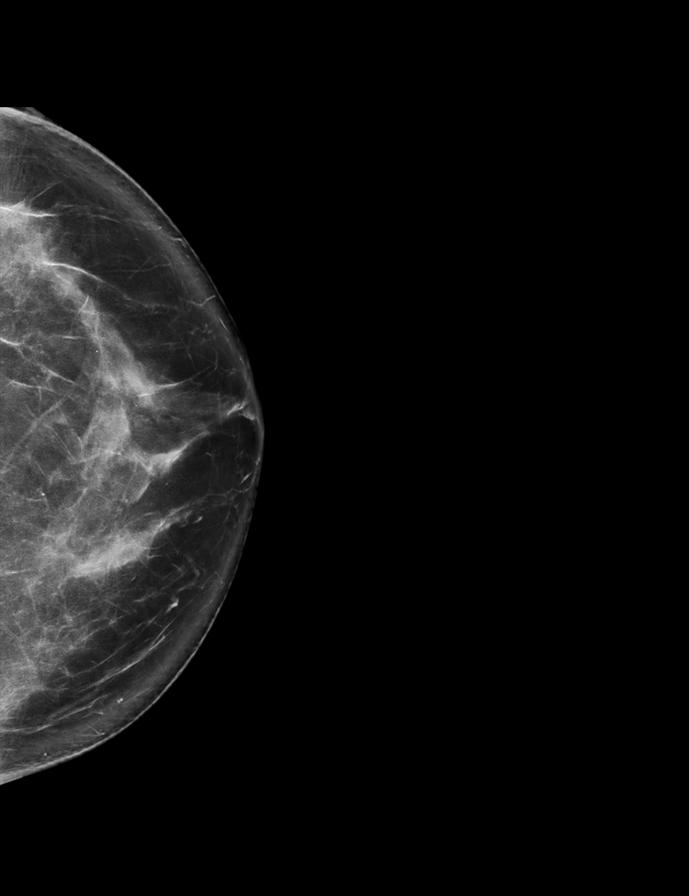

[R MLO synth-2D]
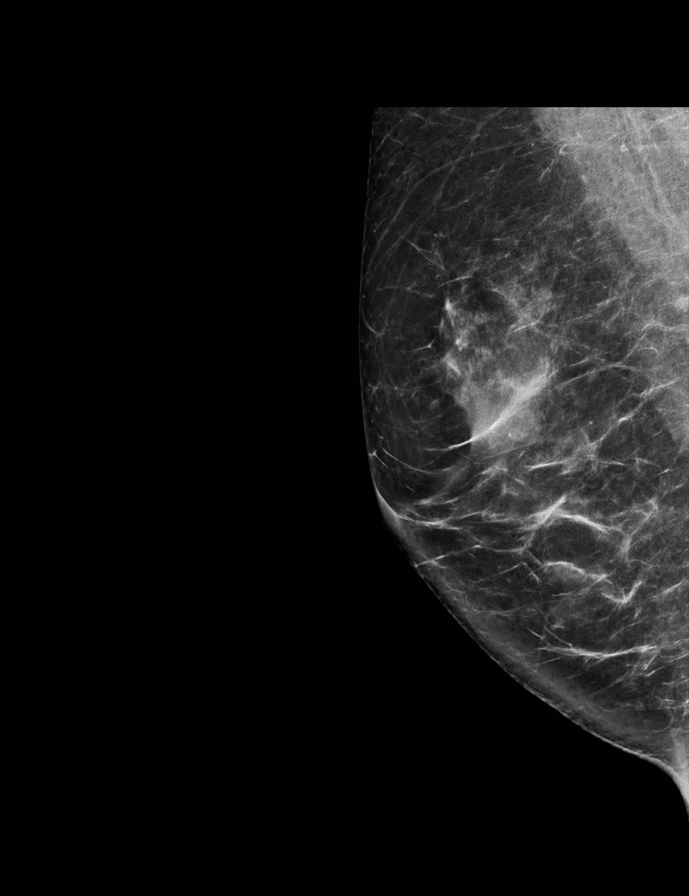

[R CC synth-2D]
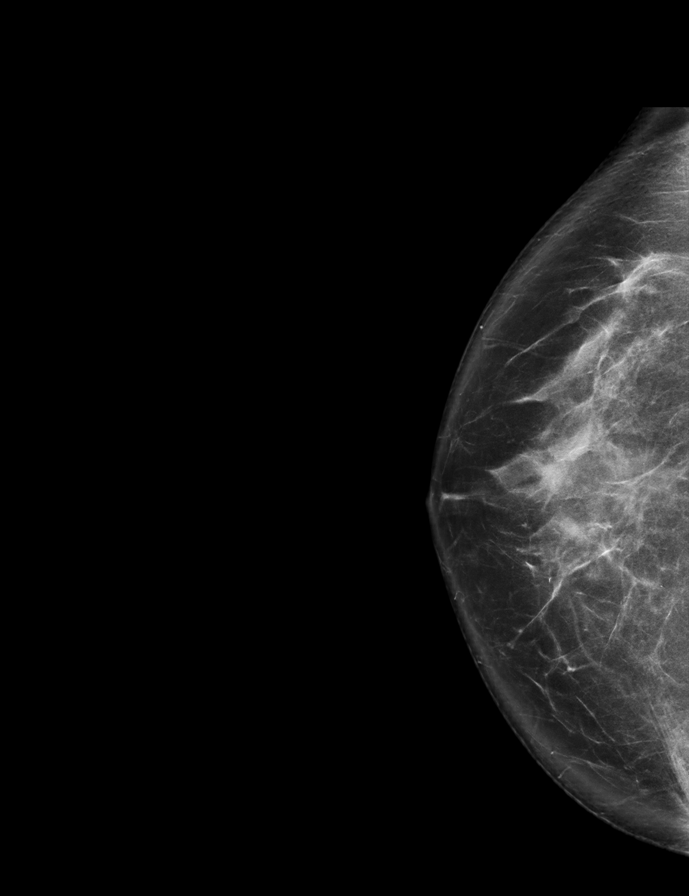

[R MLO tomo · 2 of 77 frames shown]
[frame 25/77]
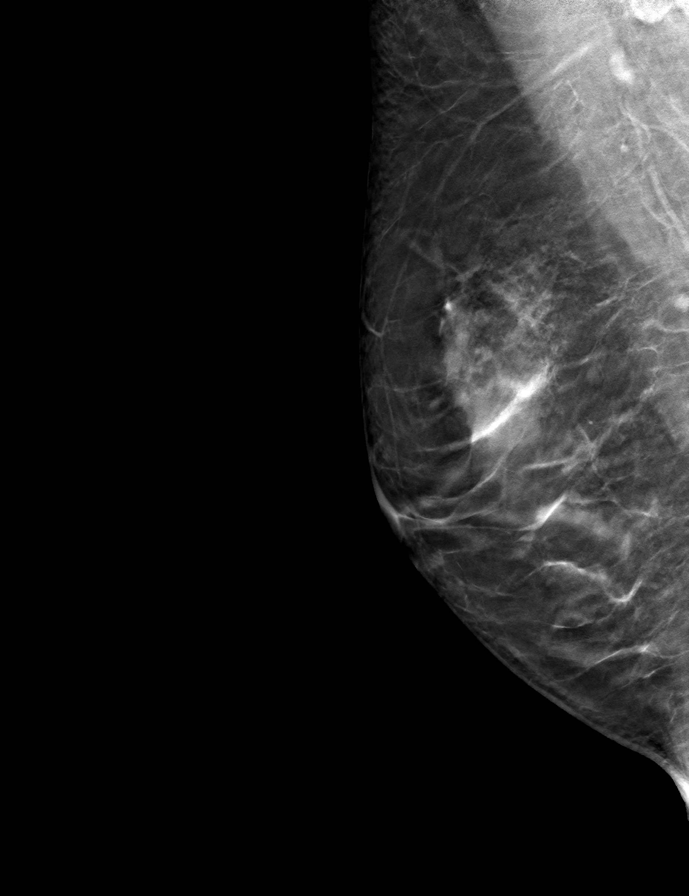
[frame 39/77]
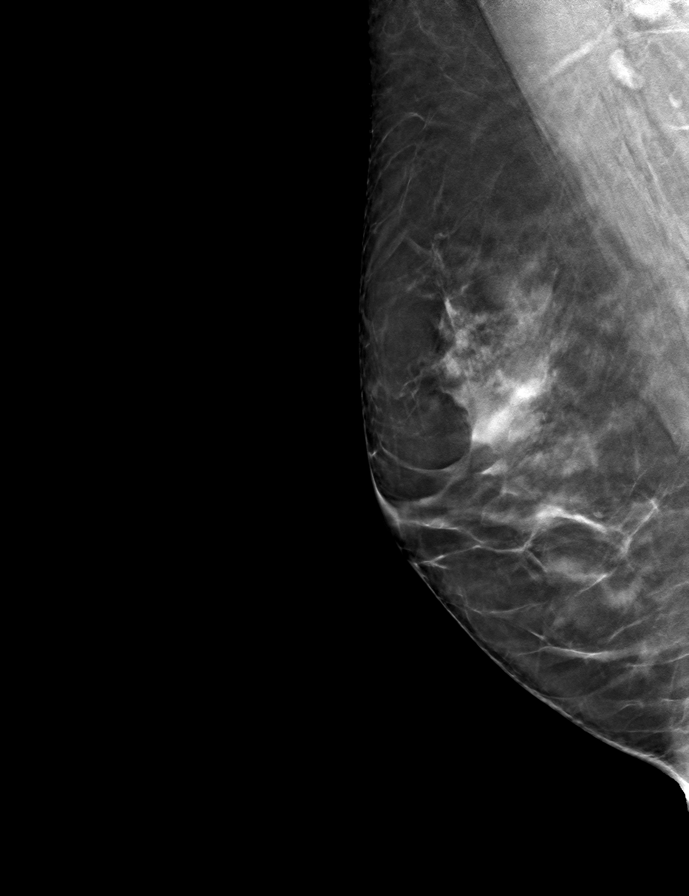

[R CC tomo · tomo slice 43/86.0]
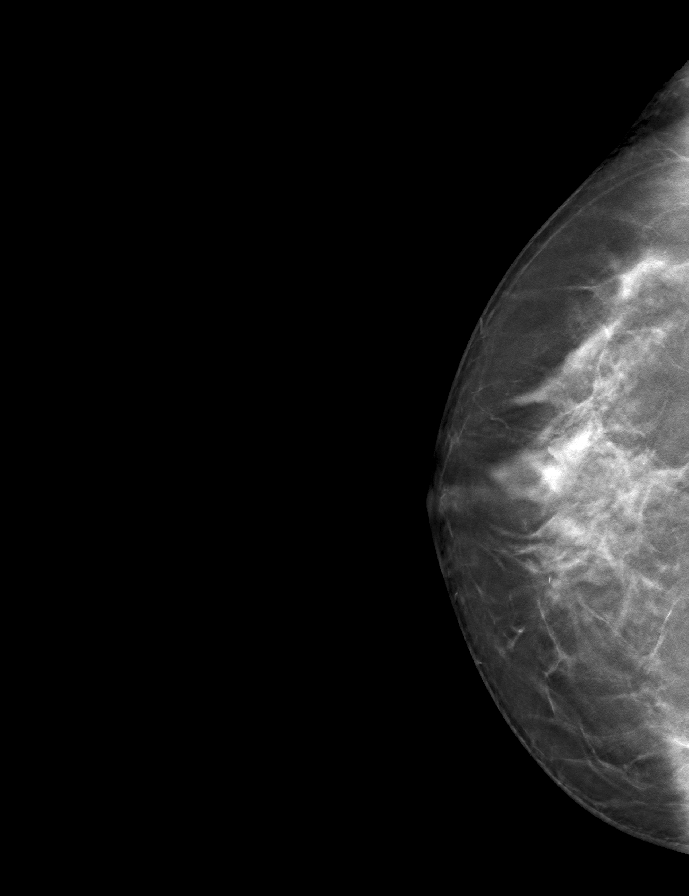

[L CC tomo · tomo slice 43/85.0]
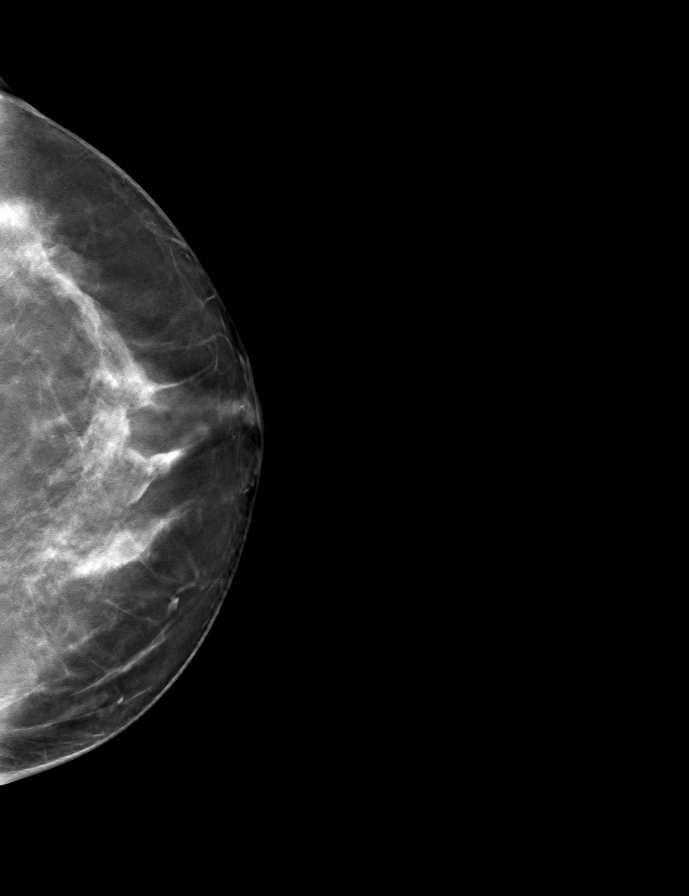

[L MLO tomo · tomo slice 41/80.0]
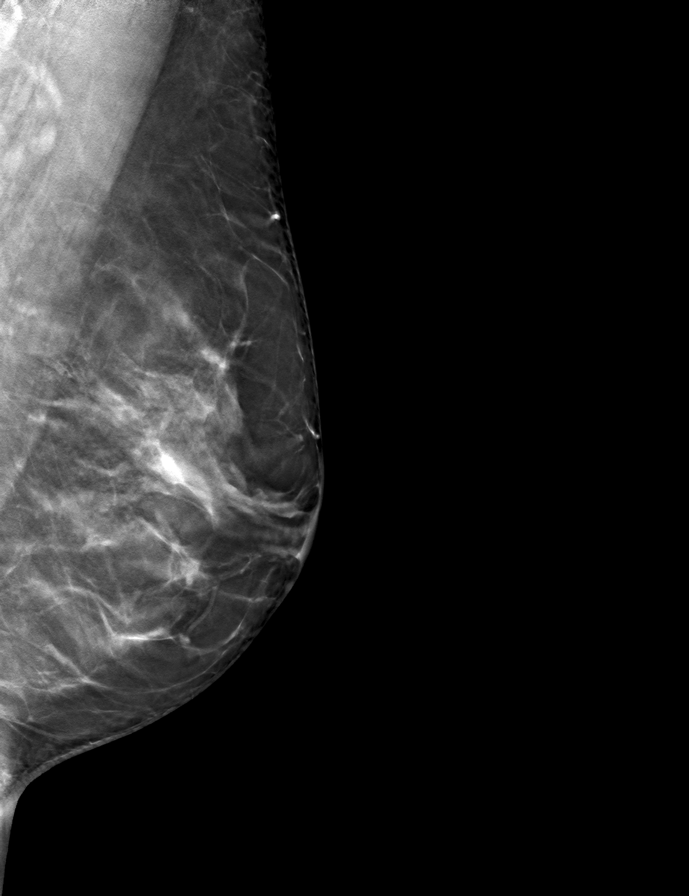

[9 of 24 positions shown; findings below may reference images not displayed]

ACR Breast Density Category c: The breast tissue is heterogeneously
dense, which may obscure small masses.
FINDINGS: There are no findings suspicious for malignancy.
IMPRESSION: No mammographic evidence of malignancy. A result letter of this
screening mammogram will be mailed directly to the patient.

RECOMMENDATION:
Screening mammogram in one year. (Code:Q3-W-BC3)

BI-RADS CATEGORY  1: Negative.

## 2023-06-14 ENCOUNTER — Encounter: Payer: Self-pay | Admitting: Family Medicine

## 2023-06-14 ENCOUNTER — Ambulatory Visit (INDEPENDENT_AMBULATORY_CARE_PROVIDER_SITE_OTHER): Payer: 59 | Admitting: Family Medicine

## 2023-06-14 VITALS — BP 112/74 | HR 64 | Temp 97.8°F | Ht 65.25 in | Wt 146.1 lb

## 2023-06-14 DIAGNOSIS — R7989 Other specified abnormal findings of blood chemistry: Secondary | ICD-10-CM

## 2023-06-14 DIAGNOSIS — Z0000 Encounter for general adult medical examination without abnormal findings: Secondary | ICD-10-CM

## 2023-06-14 LAB — LIPID PANEL
Cholesterol: 149 mg/dL (ref 0–200)
HDL: 70.7 mg/dL (ref >39.00)
LDL Cholesterol: 67 mg/dL (ref 0–99)
NonHDL: 78.02
Total CHOL/HDL Ratio: 2
Triglycerides: 55 mg/dL (ref 0.0–149.0)
VLDL: 11 mg/dL (ref 0.0–40.0)

## 2023-06-14 LAB — CBC WITH DIFFERENTIAL/PLATELET
Basophils Absolute: 0 10*3/uL (ref 0.0–0.1)
Basophils Relative: 0.7 % (ref 0.0–3.0)
Eosinophils Absolute: 0.2 10*3/uL (ref 0.0–0.7)
Eosinophils Relative: 3.2 % (ref 0.0–5.0)
HCT: 41.7 % (ref 36.0–46.0)
Hemoglobin: 13.5 g/dL (ref 12.0–15.0)
Lymphocytes Relative: 37.3 % (ref 12.0–46.0)
Lymphs Abs: 2.5 10*3/uL (ref 0.7–4.0)
MCHC: 32.4 g/dL (ref 30.0–36.0)
MCV: 86.7 fl (ref 78.0–100.0)
Monocytes Absolute: 0.6 10*3/uL (ref 0.1–1.0)
Monocytes Relative: 8.4 % (ref 3.0–12.0)
Neutro Abs: 3.4 10*3/uL (ref 1.4–7.7)
Neutrophils Relative %: 50.4 % (ref 43.0–77.0)
Platelets: 272 10*3/uL (ref 150.0–400.0)
RBC: 4.81 Mil/uL (ref 3.87–5.11)
RDW: 14.1 % (ref 11.5–15.5)
WBC: 6.8 10*3/uL (ref 4.0–10.5)

## 2023-06-14 LAB — COMPREHENSIVE METABOLIC PANEL
ALT: 13 U/L (ref 0–35)
AST: 9 U/L (ref 0–37)
Albumin: 4.5 g/dL (ref 3.5–5.2)
Alkaline Phosphatase: 74 U/L (ref 39–117)
BUN: 12 mg/dL (ref 6–23)
CO2: 29 mEq/L (ref 19–32)
Calcium: 9.7 mg/dL (ref 8.4–10.5)
Chloride: 102 mEq/L (ref 96–112)
Creatinine, Ser: 0.77 mg/dL (ref 0.40–1.20)
GFR: 88.25 mL/min (ref >60.00)
Glucose, Bld: 91 mg/dL (ref 70–99)
Potassium: 4.5 mEq/L (ref 3.5–5.1)
Sodium: 139 mEq/L (ref 135–145)
Total Bilirubin: 0.6 mg/dL (ref 0.2–1.2)
Total Protein: 7.6 g/dL (ref 6.0–8.3)

## 2023-06-14 LAB — TSH: TSH: 2.3 u[IU]/mL (ref 0.35–5.50)

## 2023-06-14 LAB — VITAMIN D 25 HYDROXY (VIT D DEFICIENCY, FRACTURES): VITD: 30.34 ng/mL (ref 30.00–100.00)

## 2023-06-14 NOTE — Progress Notes (Signed)
Established Patient Office Visit  Subjective   Patient ID: Danielle Weeks, female    DOB: [birth year]  Age: 52 y.o. MRN: [id number]  Chief Complaint  Patient presents with   Annual Exam    HPI   Danielle Weeks is seen today for physical exam/well visit.  She sees gynecologist regularly for Pap smears and mammograms.  Generally very healthy.  Takes no regular medications.  No active chronic medical problems.  Health maintenance reviewed:  -No history of shingles vaccine. -Tetanus due 2024-later this year -Mammogram and Pap smear up-to-date -Colonoscopy due 2030 -Prior hepatitis C screen negative  Family history-Sister with idiopathic recurrent pancreatitis.  Her father has history of acoustic neuroma and melanoma.  Both parents have had colon polyps.   Social history she is married.  She has 2 daughters.  She has 1 daughter who just finished high school and will be starting at [employer] next year and the other is at [employer] and is on pre-PA tract.  Patient has never smoked.  No regular alcohol use.  Works as a Veterinary surgeon.  Past Medical History:  Diagnosis Date   Allergy    ASCUS (atypical squamous cells of undetermined significance) on Pap smear    biopsied twice   Bradycardia    Chicken pox    Hypotension    TMJ (dislocation of temporomandibular joint)    UTI (lower urinary tract infection)    Past Surgical History:  Procedure Laterality Date   APPENDECTOMY     LAPAROSCOPIC APPENDECTOMY N/A 06/29/2013   Procedure: APPENDECTOMY LAPAROSCOPIC;  Surgeon: Liz Malady, MD;  Location: [facility];  Service: General;  Laterality: N/A;   NSVD     x 2    reports that she has never smoked. She has never used smokeless tobacco. She reports current alcohol use. She reports that she does not use drugs. family history includes Alcohol abuse in her maternal grandfather; Birth defects in her maternal grandmother; Cancer in her father; Colon cancer in her maternal grandmother; Colon polyps in her father  and mother; Leukemia in her paternal grandfather; Pancreatitis in her sister. Allergies  Allergen Reactions   Penicillins     GI upset    Review of Systems  Constitutional:  Negative for chills, fever, malaise/fatigue and weight loss.  HENT:  Negative for hearing loss.   Eyes:  Negative for blurred vision and double vision.  Respiratory:  Negative for cough and shortness of breath.   Cardiovascular:  Negative for chest pain, palpitations and leg swelling.  Gastrointestinal:  Negative for abdominal pain, blood in stool, constipation and diarrhea.  Genitourinary:  Negative for dysuria.  Skin:  Negative for rash.  Neurological:  Negative for dizziness, speech change, seizures, loss of consciousness and headaches.  Psychiatric/Behavioral:  Negative for depression.       Objective:     BP 112/74 (BP Location: Left Arm, Patient Position: Sitting, Cuff Size: Normal)   Pulse 64   Temp 97.8 F (36.6 C) (Oral)   Ht 5' 5.25" (1.657 m)   Wt 146 lb 1.6 oz (66.3 kg)   LMP 05/13/2019   SpO2 98%   BMI 24.13 kg/m    Physical Exam Vitals reviewed.  Constitutional:      General: She is not in acute distress.    Appearance: She is well-developed. She is not ill-appearing or toxic-appearing.  HENT:     Head: Normocephalic and atraumatic.  Eyes:     Pupils: Pupils are equal, round, and reactive to light.  Neck:     Thyroid: No thyromegaly.  Cardiovascular:     Rate and Rhythm: Normal rate and regular rhythm.     Heart sounds: Normal heart sounds. No murmur heard. Pulmonary:     Effort: No respiratory distress.     Breath sounds: Normal breath sounds. No wheezing or rales.  Musculoskeletal:        General: Normal range of motion.     Cervical back: Normal range of motion and neck supple.  Lymphadenopathy:     Cervical: No cervical adenopathy.  Skin:    Findings: No rash.  Neurological:     Mental Status: She is alert and oriented to person, place, and time.     Cranial Nerves:  No cranial nerve deficit.  Psychiatric:        Mood and Affect: Mood normal.        Behavior: Behavior normal.        Thought Content: Thought content normal.        Judgment: Judgment normal.      Results for orders placed or performed in visit on 06/14/23  VITAMIN D 25 Hydroxy (Vit-D Deficiency, Fractures)  Result Value Ref Range   VITD 30.34 30.00 - 100.00 ng/mL  TSH  Result Value Ref Range   TSH 2.30 0.35 - 5.50 uIU/mL  Lipid panel  Result Value Ref Range   Cholesterol 149 0 - 200 mg/dL   Triglycerides 69.6 0.0 - 149.0 mg/dL   HDL 29.52 >84.13 mg/dL   VLDL 24.4 0.0 - 01.0 mg/dL   LDL Cholesterol 67 0 - 99 mg/dL   Total CHOL/HDL Ratio 2    NonHDL 78.02   Comprehensive metabolic panel  Result Value Ref Range   Sodium 139 135 - 145 mEq/L   Potassium 4.5 3.5 - 5.1 mEq/L   Chloride 102 96 - 112 mEq/L   CO2 29 19 - 32 mEq/L   Glucose, Bld 91 70 - 99 mg/dL   BUN 12 6 - 23 mg/dL   Creatinine, Ser 2.72 0.40 - 1.20 mg/dL   Total Bilirubin 0.6 0.2 - 1.2 mg/dL   Alkaline Phosphatase 74 39 - 117 U/L   AST 9 0 - 37 U/L   ALT 13 0 - 35 U/L   Total Protein 7.6 6.0 - 8.3 g/dL   Albumin 4.5 3.5 - 5.2 g/dL   GFR 53.66 >44.03 mL/min   Calcium 9.7 8.4 - 10.5 mg/dL  CBC with Differential/Platelet  Result Value Ref Range   WBC 6.8 4.0 - 10.5 K/uL   RBC 4.81 3.87 - 5.11 Mil/uL   Hemoglobin 13.5 12.0 - 15.0 g/dL   HCT 47.4 25.9 - 56.3 %   MCV 86.7 78.0 - 100.0 fl   MCHC 32.4 30.0 - 36.0 g/dL   RDW 87.5 64.3 - 32.9 %   Platelets 272.0 150.0 - 400.0 K/uL   Neutrophils Relative % 50.4 43.0 - 77.0 %   Lymphocytes Relative 37.3 12.0 - 46.0 %   Monocytes Relative 8.4 3.0 - 12.0 %   Eosinophils Relative 3.2 0.0 - 5.0 %   Basophils Relative 0.7 0.0 - 3.0 %   Neutro Abs 3.4 1.4 - 7.7 K/uL   Lymphs Abs 2.5 0.7 - 4.0 K/uL   Monocytes Absolute 0.6 0.1 - 1.0 K/uL   Eosinophils Absolute 0.2 0.0 - 0.7 K/uL   Basophils Absolute 0.0 0.0 - 0.1 K/uL      The 10-year ASCVD risk score  (Arnett DK, et al., 2019) is: 0.7%  Assessment & Plan:   Problem List Items Addressed This Visit   None Visit Diagnoses     Physical exam    -  Primary   Relevant Orders   CBC with Differential/Platelet (Completed)   Comprehensive metabolic panel (Completed)   Lipid panel (Completed)   TSH (Completed)   Low vitamin D level       Relevant Orders   VITAMIN D 25 Hydroxy (Vit-D Deficiency, Fractures) (Completed)     Healthy 52 year old female.  We discussed the following health maintenance items  -Discussed that she will need a tetanus at some point later this year.  She declines today.  She would like to wait until closer to December -We discussed shingles vaccine.  Discussed potential side effects.  She would like to call back to schedule that later and is reminded that she will need to vaccines separated 2 to 6 months apart -She plans to continue GYN follow-up for Pap smear mammogram -Set up labs as above.  Patient would like to get thyroid and vitamin D level.  She is aware these may not be covered by insurance. -Continue regular weightbearing exercise -Colonoscopy up-to-date and due in 6 years  No follow-ups on file.    Evelena Peat, MD

## 2023-06-14 NOTE — Patient Instructions (Signed)
Consider Shingrix vaccine and tetanus this year

## 2023-06-15 ENCOUNTER — Other Ambulatory Visit: Payer: Self-pay | Admitting: Obstetrics and Gynecology

## 2023-06-15 DIAGNOSIS — Z0000 Encounter for general adult medical examination without abnormal findings: Secondary | ICD-10-CM

## 2023-07-18 ENCOUNTER — Ambulatory Visit
Admission: RE | Admit: 2023-07-18 | Discharge: 2023-07-18 | Disposition: A | Discharge status: Home / Self Care | Payer: 59 | Source: Ambulatory Visit | Attending: Obstetrics and Gynecology | Admitting: Obstetrics and Gynecology

## 2023-07-18 DIAGNOSIS — Z0000 Encounter for general adult medical examination without abnormal findings: Secondary | ICD-10-CM

## 2024-02-10 ENCOUNTER — Telehealth: Payer: Self-pay | Admitting: Family Medicine

## 2024-02-10 NOTE — Telephone Encounter (Signed)
Copied from CRM [id number]. Topic: Appointments - Scheduling Inquiry for Clinic >> Feb 09, 2024  4:59 PM Denese Killings wrote: Reason for CRM: Patient wants to schedule for a Tetanus and shingle shot.

## 2024-02-13 ENCOUNTER — Ambulatory Visit (INDEPENDENT_AMBULATORY_CARE_PROVIDER_SITE_OTHER): Payer: 59

## 2024-02-13 ENCOUNTER — Telehealth: Payer: Self-pay | Admitting: Family Medicine

## 2024-02-13 DIAGNOSIS — Z23 Encounter for immunization: Secondary | ICD-10-CM

## 2024-02-13 NOTE — Telephone Encounter (Signed)
 LVM for pt to call back to respond to my MyChart message to schedule requested Tetanus and Shingles injections--ok'd per Mykal.

## 2024-02-13 NOTE — Progress Notes (Signed)
 Pt declined getting shingle vaccine today. Requested to get it on Wednesday as she has important event going on tomorrow.  Shingle vaccine is scheduled to Wednesday.

## 2024-02-13 NOTE — Telephone Encounter (Signed)
 Left a message for the patient to return my call.

## 2024-02-13 NOTE — Telephone Encounter (Signed)
 Patient informed she can receive vaccines together and appt scheduled.

## 2024-02-13 NOTE — Telephone Encounter (Signed)
 Patient is calling in to see if both of her injections can be done and scheduled on the same day she would like a call back regarding this.

## 2024-02-13 NOTE — Telephone Encounter (Signed)
 LVM for pt to call back or check her MyChart to make requested Tetanus and Shingles injections- ok'd per Mykal.

## 2024-02-15 ENCOUNTER — Ambulatory Visit: Payer: 59

## 2024-03-16 ENCOUNTER — Ambulatory Visit: Payer: 59

## 2024-03-19 ENCOUNTER — Ambulatory Visit (INDEPENDENT_AMBULATORY_CARE_PROVIDER_SITE_OTHER)

## 2024-03-19 DIAGNOSIS — Z23 Encounter for immunization: Secondary | ICD-10-CM

## 2024-06-18 ENCOUNTER — Other Ambulatory Visit: Payer: Self-pay | Admitting: Obstetrics and Gynecology

## 2024-06-18 ENCOUNTER — Encounter: Payer: 59 | Admitting: Family Medicine

## 2024-06-18 DIAGNOSIS — Z1231 Encounter for screening mammogram for malignant neoplasm of breast: Secondary | ICD-10-CM

## 2024-07-02 ENCOUNTER — Ambulatory Visit: Payer: Self-pay | Admitting: Family Medicine

## 2024-07-02 ENCOUNTER — Ambulatory Visit (INDEPENDENT_AMBULATORY_CARE_PROVIDER_SITE_OTHER): Admitting: Family Medicine

## 2024-07-02 ENCOUNTER — Encounter: Payer: Self-pay | Admitting: Family Medicine

## 2024-07-02 VITALS — BP 86/60 | HR 73 | Temp 98.1°F | Ht 65.35 in | Wt 150.3 lb

## 2024-07-02 DIAGNOSIS — Z13220 Encounter for screening for lipoid disorders: Secondary | ICD-10-CM

## 2024-07-02 DIAGNOSIS — Z0001 Encounter for general adult medical examination with abnormal findings: Secondary | ICD-10-CM | POA: Diagnosis not present

## 2024-07-02 DIAGNOSIS — E559 Vitamin D deficiency, unspecified: Secondary | ICD-10-CM

## 2024-07-02 DIAGNOSIS — Z01419 Encounter for gynecological examination (general) (routine) without abnormal findings: Secondary | ICD-10-CM | POA: Diagnosis not present

## 2024-07-02 DIAGNOSIS — R5383 Other fatigue: Secondary | ICD-10-CM | POA: Diagnosis not present

## 2024-07-02 DIAGNOSIS — Z23 Encounter for immunization: Secondary | ICD-10-CM | POA: Diagnosis not present

## 2024-07-02 DIAGNOSIS — Z0000 Encounter for general adult medical examination without abnormal findings: Secondary | ICD-10-CM

## 2024-07-02 LAB — HEPATIC FUNCTION PANEL
ALT: 16 U/L (ref 0–35)
AST: 10 U/L (ref 0–37)
Albumin: 4.5 g/dL (ref 3.5–5.2)
Alkaline Phosphatase: 76 U/L (ref 39–117)
Bilirubin, Direct: 0.1 mg/dL (ref 0.0–0.3)
Total Bilirubin: 0.6 mg/dL (ref 0.2–1.2)
Total Protein: 6.9 g/dL (ref 6.0–8.3)

## 2024-07-02 LAB — CBC WITH DIFFERENTIAL/PLATELET
Basophils Absolute: 0 K/uL (ref 0.0–0.1)
Basophils Relative: 0.8 % (ref 0.0–3.0)
Eosinophils Absolute: 0.2 K/uL (ref 0.0–0.7)
Eosinophils Relative: 3.9 % (ref 0.0–5.0)
HCT: 40.1 % (ref 36.0–46.0)
Hemoglobin: 13.2 g/dL (ref 12.0–15.0)
Lymphocytes Relative: 46.7 % — ABNORMAL HIGH (ref 12.0–46.0)
Lymphs Abs: 2.4 K/uL (ref 0.7–4.0)
MCHC: 33 g/dL (ref 30.0–36.0)
MCV: 84.9 fl (ref 78.0–100.0)
Monocytes Absolute: 0.4 K/uL (ref 0.1–1.0)
Monocytes Relative: 8.6 % (ref 3.0–12.0)
Neutro Abs: 2 K/uL (ref 1.4–7.7)
Neutrophils Relative %: 40 % — ABNORMAL LOW (ref 43.0–77.0)
Platelets: 251 K/uL (ref 150.0–400.0)
RBC: 4.72 Mil/uL (ref 3.87–5.11)
RDW: 14.5 % (ref 11.5–15.5)
WBC: 5.1 K/uL (ref 4.0–10.5)

## 2024-07-02 LAB — LIPID PANEL
Cholesterol: 173 mg/dL (ref 0–200)
HDL: 70.7 mg/dL (ref >39.00)
LDL Cholesterol: 89 mg/dL (ref 0–99)
NonHDL: 101.8
Total CHOL/HDL Ratio: 2
Triglycerides: 64 mg/dL (ref 0.0–149.0)
VLDL: 12.8 mg/dL (ref 0.0–40.0)

## 2024-07-02 LAB — BASIC METABOLIC PANEL WITH GFR
BUN: 13 mg/dL (ref 6–23)
CO2: 30 meq/L (ref 19–32)
Calcium: 9.4 mg/dL (ref 8.4–10.5)
Chloride: 103 meq/L (ref 96–112)
Creatinine, Ser: 0.71 mg/dL (ref 0.40–1.20)
GFR: 96.55 mL/min (ref >60.00)
Glucose, Bld: 94 mg/dL (ref 70–99)
Potassium: 4.5 meq/L (ref 3.5–5.1)
Sodium: 141 meq/L (ref 135–145)

## 2024-07-02 LAB — VITAMIN D 25 HYDROXY (VIT D DEFICIENCY, FRACTURES): VITD: 25.46 ng/mL — ABNORMAL LOW (ref 30.00–100.00)

## 2024-07-02 LAB — TSH: TSH: 2.31 u[IU]/mL (ref 0.35–5.50)

## 2024-07-02 NOTE — Progress Notes (Signed)
 Established Patient Office Visit  Subjective   Patient ID: Danielle Weeks, female    DOB: 05-09-1971  Age: 53 y.o. MRN: [id number]  Chief Complaint  Patient presents with   Annual Exam    HPI   Danielle Weeks seen for physical exam.  Generally doing well.  Takes no regular medications.  Does have some nonspecific fatigue.  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  She realized this may be contributing.  Overall, appetite and weight stable.  She still sees GYN for Pap smears and mammograms.  Health maintenance reviewed:  Health Maintenance  Topic Date Due   HIV Screening  Never done   Hepatitis B Vaccines (1 of 3 - 19+ 3-dose series) Never done   Cervical Cancer Screening (HPV/Pap Cotest)  01/09/2023   COVID-19 Vaccine (1 - 2024-25 season) Never done   Zoster Vaccines- Shingrix  (2 of 2) 05/14/2024   INFLUENZA VACCINE  07/27/2024   MAMMOGRAM  07/17/2025   Colonoscopy  05/29/2029   DTaP/Tdap/Td (4 - Td or Tdap) 02/12/2034   Hepatitis C Screening  Completed   HPV VACCINES  Aged Out   Meningococcal B Vaccine  Aged Out   - Has received first shingles vaccine in March and would like to get second today. -Repeat colonoscopy due 2030  History-married and has 2 daughters.  They both have attended [employer].  Patient has never smoked.  Works in Armed forces training and education officer.  Family history-Sister with idiopathic recurrent pancreatitis. Her father has history of acoustic neuroma and melanoma. Both parents have had colon polyps.  Sister also has reportedly type 2 diabetes.  Past Medical History:  Diagnosis Date   Allergy    ASCUS (atypical squamous cells of undetermined significance) on Pap smear    biopsied twice   Bradycardia    Chicken pox    Hypotension    TMJ (dislocation of temporomandibular joint)    UTI (lower urinary tract infection)    Past Surgical History:  Procedure Laterality Date   APPENDECTOMY     LAPAROSCOPIC APPENDECTOMY N/A 06/29/2013    Procedure: APPENDECTOMY LAPAROSCOPIC;  Surgeon: Dann FORBES Hummer, MD;  Location: [facility];  Service: General;  Laterality: N/A;   NSVD     x 2    reports that she has never smoked. She has never used smokeless tobacco. She reports current alcohol use. She reports that she does not use drugs. family history includes Alcohol abuse in her maternal grandfather; Birth defects in her maternal grandmother; Cancer in her father; Colon cancer in her maternal grandmother; Colon polyps in her father and mother; Leukemia in her paternal grandfather; Pancreatitis in her sister. Allergies  Allergen Reactions   Penicillins     GI upset     Review of Systems  Constitutional:  Negative for chills, fever, malaise/fatigue and weight loss.  HENT:  Negative for hearing loss.   Eyes:  Negative for blurred vision and double vision.  Respiratory:  Negative for cough and shortness of breath.   Cardiovascular:  Negative for chest pain, palpitations and leg swelling.  Gastrointestinal:  Negative for abdominal pain, blood in stool, constipation and diarrhea.  Genitourinary:  Negative for dysuria.  Skin:  Negative for rash.  Neurological:  Negative for dizziness, speech change, seizures, loss of consciousness and headaches.  Psychiatric/Behavioral:  Negative for depression.       Objective:     BP (!) 86/60 (BP Location: Left Arm, Patient Position: Sitting, Cuff Size: Normal)  Pulse 73   Temp 98.1 F (36.7 C) (Oral)   Ht 5' 5.35 (1.66 m)   Wt 150 lb 4.8 oz (68.2 kg)   LMP 05/13/2019   SpO2 97%   BMI 24.74 kg/m  BP Readings from Last 3 Encounters:  07/02/24 (!) 86/60  06/14/23 112/74  04/28/22 100/66   Wt Readings from Last 3 Encounters:  07/02/24 150 lb 4.8 oz (68.2 kg)  06/14/23 146 lb 1.6 oz (66.3 kg)  04/28/22 147 lb 11.2 oz (67 kg)      Physical Exam Vitals reviewed.  Constitutional:      General: She is not in acute distress.    Appearance: She is well-developed. She is not  ill-appearing.  HENT:     Head: Normocephalic and atraumatic.     Right Ear: Tympanic membrane normal.     Left Ear: Tympanic membrane normal.     Mouth/Throat:     Mouth: Mucous membranes are moist.     Pharynx: Oropharynx is clear. No oropharyngeal exudate or posterior oropharyngeal erythema.  Eyes:     Pupils: Pupils are equal, round, and reactive to light.  Neck:     Thyroid : No thyromegaly.  Cardiovascular:     Rate and Rhythm: Normal rate and regular rhythm.     Heart sounds: Normal heart sounds. No murmur heard. Pulmonary:     Effort: No respiratory distress.     Breath sounds: Normal breath sounds. No wheezing or rales.  Musculoskeletal:     Cervical back: Normal range of motion and neck supple.  Lymphadenopathy:     Cervical: No cervical adenopathy.  Skin:    Findings: No rash.  Neurological:     Mental Status: She is alert and oriented to person, place, and time.     Cranial Nerves: No cranial nerve deficit.  Psychiatric:        Behavior: Behavior normal.        Thought Content: Thought content normal.        Judgment: Judgment normal.      No results found for any visits on 07/02/24.    The ASCVD Risk score (Arnett DK, et al., 2019) failed to calculate for the following reasons:   The valid systolic blood pressure range is 90 to 200 mmHg    Assessment & Plan:   Problem List Items Addressed This Visit   None Visit Diagnoses       Physical exam    -  Primary   Relevant Orders   Basic metabolic panel with GFR   Lipid panel   CBC with Differential/Platelet   Hepatic function panel     Fatigue, unspecified type       Relevant Orders   TSH     Vitamin D  deficiency       Relevant Orders   Vitamin D , 25-hydroxy     Healthy 53 year old female.  Her blood pressure today is 86/60 but she denies any dizziness.  She states this is near her norm.  Does have some nonspecific fatigue but otherwise feels well.  Will check labs as above and include TSH.   Patient also requesting vitamin D  level.  She has history of mild low vitamin D .  Currently not taking regular supplement.  No follow-ups on file.    Wolm Scarlet, MD

## 2024-07-02 NOTE — Addendum Note (Signed)
 Addended by: METTA KRISTEN CROME on: 07/02/2024 09:07 AM   Modules accepted: Orders

## 2024-07-19 ENCOUNTER — Ambulatory Visit
Admission: RE | Admit: 2024-07-19 | Discharge: 2024-07-19 | Disposition: A | Discharge status: Home / Self Care | Source: Ambulatory Visit | Attending: Obstetrics and Gynecology | Admitting: Obstetrics and Gynecology

## 2024-07-19 DIAGNOSIS — Z1231 Encounter for screening mammogram for malignant neoplasm of breast: Secondary | ICD-10-CM

## 2024-10-26 DIAGNOSIS — H52203 Unspecified astigmatism, bilateral: Secondary | ICD-10-CM | POA: Diagnosis not present

## 2024-10-26 DIAGNOSIS — H5213 Myopia, bilateral: Secondary | ICD-10-CM | POA: Diagnosis not present

## 2024-10-26 DIAGNOSIS — H04123 Dry eye syndrome of bilateral lacrimal glands: Secondary | ICD-10-CM | POA: Diagnosis not present
